# Patient Record
Sex: Male | Born: 1950 | Race: Black or African American | Hispanic: No | Smoking: Never smoker
Health system: Southern US, Community
[De-identification: ages and names within clinical notes are randomized; demographics above are authoritative.]

## PROBLEM LIST (undated history)

## (undated) DIAGNOSIS — A630 Anogenital (venereal) warts: Principal | ICD-10-CM

## (undated) DIAGNOSIS — Z21 Asymptomatic human immunodeficiency virus [HIV] infection status: Secondary | ICD-10-CM

## (undated) DIAGNOSIS — B20 Human immunodeficiency virus [HIV] disease: Secondary | ICD-10-CM

## (undated) DIAGNOSIS — F32A Depression, unspecified: Secondary | ICD-10-CM

## (undated) DIAGNOSIS — F329 Major depressive disorder, single episode, unspecified: Secondary | ICD-10-CM

## (undated) HISTORY — DX: Anogenital (venereal) warts: A63.0

## (undated) HISTORY — DX: Human immunodeficiency virus (HIV) disease: B20

## (undated) HISTORY — DX: Asymptomatic human immunodeficiency virus (hiv) infection status: Z21

## (undated) HISTORY — PX: KNEE SURGERY: SHX244

---

## 2000-03-07 ENCOUNTER — Inpatient Hospital Stay (HOSPITAL_COMMUNITY): Admission: EM | Admit: 2000-03-07 | Discharge: 2000-03-08 | Payer: Self-pay | Admitting: Emergency Medicine

## 2000-03-07 ENCOUNTER — Encounter: Payer: Self-pay | Admitting: Emergency Medicine

## 2000-03-08 ENCOUNTER — Encounter: Payer: Self-pay | Admitting: Internal Medicine

## 2001-11-29 ENCOUNTER — Encounter: Payer: Self-pay | Admitting: Emergency Medicine

## 2001-11-29 ENCOUNTER — Inpatient Hospital Stay (HOSPITAL_COMMUNITY): Admission: EM | Admit: 2001-11-29 | Discharge: 2001-12-03 | Payer: Self-pay | Admitting: Emergency Medicine

## 2002-01-05 ENCOUNTER — Inpatient Hospital Stay (HOSPITAL_COMMUNITY): Admission: EM | Admit: 2002-01-05 | Discharge: 2002-01-06 | Payer: Self-pay | Admitting: *Deleted

## 2010-02-13 ENCOUNTER — Ambulatory Visit: Payer: Self-pay | Admitting: Psychiatry

## 2010-02-13 ENCOUNTER — Encounter: Payer: Self-pay | Admitting: Emergency Medicine

## 2010-03-04 ENCOUNTER — Emergency Department (HOSPITAL_COMMUNITY): Admission: EM | Admit: 2010-03-04 | Discharge: 2010-03-04 | Payer: Self-pay | Admitting: Emergency Medicine

## 2010-03-11 ENCOUNTER — Emergency Department (HOSPITAL_COMMUNITY): Admission: EM | Admit: 2010-03-11 | Discharge: 2010-03-11 | Payer: Self-pay | Admitting: Emergency Medicine

## 2010-04-25 ENCOUNTER — Emergency Department (HOSPITAL_COMMUNITY): Admission: EM | Admit: 2010-04-25 | Discharge: 2010-04-25 | Payer: Self-pay | Admitting: Emergency Medicine

## 2010-05-01 ENCOUNTER — Emergency Department (HOSPITAL_COMMUNITY): Admission: EM | Admit: 2010-05-01 | Discharge: 2010-05-01 | Payer: Self-pay | Admitting: Emergency Medicine

## 2010-05-19 ENCOUNTER — Emergency Department (HOSPITAL_COMMUNITY): Admission: EM | Admit: 2010-05-19 | Discharge: 2010-05-19 | Payer: Self-pay | Admitting: Emergency Medicine

## 2010-06-07 ENCOUNTER — Encounter
Admission: RE | Admit: 2010-06-07 | Discharge: 2010-06-26 | Payer: Self-pay | Source: Home / Self Care | Attending: *Deleted | Admitting: *Deleted

## 2010-06-29 ENCOUNTER — Emergency Department (HOSPITAL_COMMUNITY): Admission: EM | Admit: 2010-06-29 | Discharge: 2010-06-29 | Payer: Self-pay | Admitting: Emergency Medicine

## 2010-07-20 ENCOUNTER — Inpatient Hospital Stay (HOSPITAL_COMMUNITY): Admission: AD | Admit: 2010-07-20 | Discharge: 2010-02-15 | Payer: Self-pay | Admitting: Psychiatry

## 2010-08-09 ENCOUNTER — Emergency Department (HOSPITAL_COMMUNITY)
Admission: EM | Admit: 2010-08-09 | Discharge: 2010-08-09 | Payer: Self-pay | Source: Home / Self Care | Admitting: Emergency Medicine

## 2010-08-17 ENCOUNTER — Encounter: Payer: Self-pay | Admitting: Adult Health

## 2010-08-17 DIAGNOSIS — B20 Human immunodeficiency virus [HIV] disease: Secondary | ICD-10-CM | POA: Insufficient documentation

## 2010-08-28 ENCOUNTER — Encounter
Admission: RE | Admit: 2010-08-28 | Discharge: 2010-09-12 | Payer: Self-pay | Source: Home / Self Care | Attending: Orthopedic Surgery | Admitting: Orthopedic Surgery

## 2010-08-29 ENCOUNTER — Ambulatory Visit
Admission: RE | Admit: 2010-08-29 | Discharge: 2010-08-29 | Payer: Self-pay | Source: Home / Self Care | Attending: Adult Health | Admitting: Adult Health

## 2010-08-29 ENCOUNTER — Encounter: Payer: Self-pay | Admitting: Adult Health

## 2010-08-29 LAB — CONVERTED CEMR LAB
ALT: 25 units/L (ref 0–53)
AST: 29 units/L (ref 0–37)
Albumin: 4.5 g/dL (ref 3.5–5.2)
Bacteria, UA: NONE SEEN
Basophils Absolute: 0 10*3/uL (ref 0.0–0.1)
Bilirubin Urine: NEGATIVE
Calcium: 9.4 mg/dL (ref 8.4–10.5)
Eosinophils Absolute: 0.1 10*3/uL (ref 0.0–0.7)
GC Probe Amp, Urine: NEGATIVE
HDL: 44 mg/dL (ref >39)
HIV-1 RNA Quant, Log: 4.73 — ABNORMAL HIGH (ref <1.30)
Hemoglobin: 15 g/dL (ref 13.0–17.0)
Hep B Core Total Ab: POSITIVE — AB
Hepatitis B Surface Ag: NEGATIVE
Lymphocytes Relative: 19 % (ref 12–46)
Lymphs Abs: 0.7 10*3/uL (ref 0.7–4.0)
MCHC: 33.6 g/dL (ref 30.0–36.0)
MCV: 92.9 fL (ref 78.0–100.0)
Monocytes Relative: 11 % (ref 3–12)
Neutrophils Relative %: 68 % (ref 43–77)
Platelets: 133 10*3/uL — ABNORMAL LOW (ref 150–400)
RDW: 13.3 % (ref 11.5–15.5)
Sodium: 143 meq/L (ref 135–145)
Specific Gravity, Urine: 1.024 (ref 1.005–1.030)
Squamous Epithelial / LPF: NONE SEEN /lpf
Total Bilirubin: 0.3 mg/dL (ref 0.3–1.2)
Triglycerides: 89 mg/dL (ref <150)
Urine Glucose: NEGATIVE mg/dL
VLDL: 18 mg/dL (ref 0–40)
pH: 6.5 (ref 5.0–8.0)

## 2010-09-04 LAB — T-HELPER CELL (CD4) - (RCID CLINIC ONLY): CD4 % Helper T Cell: 6 % — ABNORMAL LOW (ref 33–55)

## 2010-09-12 ENCOUNTER — Ambulatory Visit: Admit: 2010-09-12 | Payer: Self-pay | Admitting: Adult Health

## 2010-09-14 NOTE — Miscellaneous (Signed)
Summary: Orders Update  Clinical Lists Changes  Problems: Added new problem of HIV INFECTION (ICD-042) Orders: Added new Test order of T-Chlamydia  Probe, urine [phone]) - Signed Added new Test order of T-CBC w/Diff [phone]) - Signed Added new Test order of T-CD4SP [facility] [place]) (CD4SP) - Signed Added new Test order of T-GC Probe, urine [phone]) - Signed Added new Test order of T-Comprehensive Metabolic Panel [phone]) - Signed Added new Test order of T-Hepatitis B Surface Antigen [phone]) - Signed Added new Test order of T-Hepatitis B Surface Antibody [phone]) - Signed Added new Test order of T-Hepatitis B Core Antibody ([id number]) - Signed Added new Test order of T-Hepatitis C Antibody ([id number]) - Signed Added new Test order of T-Hepatitis A Antibody ([id number]) - Signed Added new Test order of T-HIV Ab Confirmatory Test/Western Blot ([id number]) - Signed Added new Test order of T-Lipid Profile ([id number]) - Signed Added new Test order of T-RPR (Syphilis) ([id number]) - Signed Added new Test order of T-Urinalysis ([id number]) - Signed Added new Test order of T-HIV Viral Load ([id number]) - Signed

## 2010-09-14 NOTE — Miscellaneous (Signed)
Summary: HIPAA Restrictions  HIPAA Restrictions   Imported By: Florinda Marker 08/29/2010 09:59:07  _____________________________________________________________________  External Attachment:    Type:   Image     Comment:   External Document

## 2010-09-15 ENCOUNTER — Ambulatory Visit: Payer: Self-pay | Admitting: Adult Health

## 2010-09-22 ENCOUNTER — Encounter: Payer: Self-pay | Admitting: Adult Health

## 2010-09-22 ENCOUNTER — Ambulatory Visit (INDEPENDENT_AMBULATORY_CARE_PROVIDER_SITE_OTHER): Payer: Medicaid Other | Admitting: Adult Health

## 2010-09-22 DIAGNOSIS — B1710 Acute hepatitis C without hepatic coma: Secondary | ICD-10-CM | POA: Insufficient documentation

## 2010-09-22 DIAGNOSIS — B20 Human immunodeficiency virus [HIV] disease: Secondary | ICD-10-CM

## 2010-09-22 LAB — CONVERTED CEMR LAB
HCV Quantitative: 20300 intl units/mL — ABNORMAL HIGH (ref <43)
HIV 1 RNA Quant: 59400 copies/mL — ABNORMAL HIGH (ref <20)
HIV-1 RNA Quant, Log: 4.77 — ABNORMAL HIGH (ref <1.30)

## 2010-10-06 ENCOUNTER — Ambulatory Visit (INDEPENDENT_AMBULATORY_CARE_PROVIDER_SITE_OTHER): Payer: Medicaid Other | Admitting: Adult Health

## 2010-10-06 ENCOUNTER — Encounter (INDEPENDENT_AMBULATORY_CARE_PROVIDER_SITE_OTHER): Payer: Self-pay | Admitting: *Deleted

## 2010-10-06 DIAGNOSIS — B20 Human immunodeficiency virus [HIV] disease: Secondary | ICD-10-CM

## 2010-10-10 NOTE — Assessment & Plan Note (Signed)
Summary: f/u   Vital Signs:  Patient profile:   60 year old male Height:      70 inches Weight:      193 pounds BMI:     27.79 Temp:     98.2 degrees F oral Pulse rate:   67 / minute BP sitting:   154 / 92  (left arm)  Vitals Entered By: Alesia Morin CMA (September 22, 2010 11:08 AM) CC: follow-up visit for labs, and to discuss medication Is Patient Diabetic? No Pain Assessment Patient in pain? no      Nutritional Status BMI of 25 - 29 = overweight Nutritional Status Detail appetite "good"  Have you ever been in a relationship where you felt threatened, hurt or afraid?No   Does patient need assistance? Functional Status Self care Ambulation Normal Comments out for about 3 months   CC:  follow-up visit for labs and and to discuss medication.  Preventive Screening-Counseling & Management  Alcohol-Tobacco     Alcohol drinks/day: 0     Smoking Status: never  Caffeine-Diet-Exercise     Caffeine use/day: 0  Safety-Violence-Falls     Seat Belt Use: yes      Sexual History:  HIV + partner.        Drug Use:  former and marijuana.        Blood Transfusions:  no.        Travel History:  no.    Allergies (verified): 1)  ! Bactrim (Sulfamethoxazole-Trimethoprim) 2)  ! Seroquel (Quetiapine Fumarate)  Past History:  Past Surgical History: Left knee ligament repair 2008  Social History: Drug Use:  former, marijuana Sexual History:  HIV + partner Blood Transfusions:  no Travel History:  no  Review of Systems General:  Denies chills, fatigue, fever, loss of appetite, malaise, sleep disorder, sweats, weakness, and weight loss. Eyes:  Denies blurring, discharge, double vision, eye irritation, eye pain, halos, itching, light sensitivity, red eye, vision loss-1 eye, and vision loss-both eyes. ENT:  Denies decreased hearing, difficulty swallowing, ear discharge, earache, hoarseness, nasal congestion, nosebleeds, postnasal drainage, ringing in ears, sinus pressure, and  sore throat. CV:  Denies bluish discoloration of lips or nails, chest pain or discomfort, difficulty breathing at night, difficulty breathing while lying down, fainting, fatigue, leg cramps with exertion, lightheadness, near fainting, palpitations, shortness of breath with exertion, swelling of feet, swelling of hands, and weight gain. Resp:  Denies chest discomfort, chest pain with inspiration, cough, coughing up blood, excessive snoring, hypersomnolence, morning headaches, pleuritic, shortness of breath, sputum productive, and wheezing. GI:  Denies abdominal pain, bloody stools, change in bowel habits, constipation, dark tarry stools, diarrhea, excessive appetite, gas, hemorrhoids, indigestion, loss of appetite, nausea, vomiting, vomiting blood, and yellowish skin color. GU:  Denies decreased libido, discharge, dysuria, erectile dysfunction, genital sores, hematuria, incontinence, nocturia, urinary frequency, and urinary hesitancy. MS:  Complains of joint pain. Derm:  Denies changes in color of skin, changes in nail beds, dryness, excessive perspiration, flushing, hair loss, insect bite(s), itching, lesion(s), poor wound healing, and rash. Neuro:  Denies brief paralysis, difficulty with concentration, disturbances in coordination, falling down, headaches, inability to speak, memory loss, numbness, poor balance, seizures, sensation of room spinning, tingling, tremors, visual disturbances, and weakness. Psych:  Complains of depression and mental problems; denies alternate hallucination ( auditory/visual), anxiety, easily angered, easily tearful, irritability, panic attacks, sense of great danger, suicidal thoughts/plans, thoughts of violence, unusual visions or sounds, and thoughts /plans of harming others. Endo:  Denies cold intolerance, excessive  hunger, excessive thirst, excessive urination, heat intolerance, polyuria, and weight change. Heme:  Denies abnormal bruising, bleeding, enlarge lymph nodes,  fevers, pallor, and skin discoloration.   Medications Added to Medication List This Visit: 1)  Dapsone 100 Mg Tabs (Dapsone) .... Take one (1) tablet by mouth once daily  Other Orders: T-Hepatitis C Viral Load ([id number]) T-HIV1 Quant rflx Ultra or Genotype ([id number]) New Patient Level IV (29562) Prescriptions: DAPSONE 100 MG TABS (DAPSONE) Take one (1) tablet by mouth once daily  #30 x 5   Entered and Authorized by:   Talmadge Chad NP   Signed by:   Talmadge Chad NP on 09/22/2010   Method used:   Electronically to        [facility]  [address] [phone]* (retail)       [address]       [place], [place]  [address]       Ph: [phone] or [phone]       Fax: [phone]   RxID:   [id number]    Orders Added: 1)  T-Hepatitis C Viral Load [phone] 2)  T-HIV1 Quant rflx Ultra or Genotype [phone] 3)  New Patient Level IV [35573]

## 2010-10-10 NOTE — Miscellaneous (Addendum)
Summary: Bridge Counselor  Clinical Lists Changes BC transported pt to [facility] today for his scheduled appointent with Traci Sermon. Pt was put on Truvada and Isentress today. BC discussed with pt each of medications and when and how to take them. Pt expressed understanding. BC will follow pt until he is on his medications without problem and then refer him to [facility] for ongoing case management.

## 2010-10-28 LAB — DIFFERENTIAL
Basophils Absolute: 0 10*3/uL (ref 0.0–0.1)
Basophils Absolute: 0 10*3/uL (ref 0.0–0.1)
Basophils Relative: 0 % (ref 0–1)
Eosinophils Relative: 3 % (ref 0–5)
Lymphocytes Relative: 20 % (ref 12–46)
Lymphocytes Relative: 21 % (ref 12–46)
Lymphs Abs: 1.2 10*3/uL (ref 0.7–4.0)
Monocytes Relative: 11 % (ref 3–12)
Monocytes Relative: 13 % — ABNORMAL HIGH (ref 3–12)
Neutro Abs: 3.9 10*3/uL (ref 1.7–7.7)
Neutrophils Relative %: 63 % (ref 43–77)

## 2010-10-28 LAB — POCT I-STAT, CHEM 8
BUN: 17 mg/dL (ref 6–23)
Calcium, Ion: 1.06 mmol/L — ABNORMAL LOW (ref 1.12–1.32)
Calcium, Ion: 1.12 mmol/L (ref 1.12–1.32)
Creatinine, Ser: 1.1 mg/dL (ref 0.4–1.5)
Creatinine, Ser: 1.2 mg/dL (ref 0.4–1.5)
Glucose, Bld: 113 mg/dL — ABNORMAL HIGH (ref 70–99)
HCT: 44 % (ref 39.0–52.0)
HCT: 44 % (ref 39.0–52.0)
Hemoglobin: 15 g/dL (ref 13.0–17.0)
Hemoglobin: 15 g/dL (ref 13.0–17.0)
Sodium: 142 mEq/L (ref 135–145)
Sodium: 142 mEq/L (ref 135–145)
TCO2: 27 mmol/L (ref 0–100)

## 2010-10-28 LAB — RAPID URINE DRUG SCREEN, HOSP PERFORMED
Amphetamines: NOT DETECTED
Barbiturates: NOT DETECTED
Benzodiazepines: NOT DETECTED
Cocaine: NOT DETECTED
Tetrahydrocannabinol: NOT DETECTED

## 2010-10-28 LAB — CBC
HCT: 40.3 % (ref 39.0–52.0)
Hemoglobin: 13.4 g/dL (ref 13.0–17.0)
Hemoglobin: 14.1 g/dL (ref 13.0–17.0)
MCH: 31.9 pg (ref 26.0–34.0)
MCH: 32.2 pg (ref 26.0–34.0)
MCHC: 33.3 g/dL (ref 30.0–36.0)
MCHC: 33.5 g/dL (ref 30.0–36.0)
MCV: 95.8 fL (ref 78.0–100.0)
Platelets: 116 10*3/uL — ABNORMAL LOW (ref 150–400)
RDW: 16 % — ABNORMAL HIGH (ref 11.5–15.5)
RDW: 16.2 % — ABNORMAL HIGH (ref 11.5–15.5)

## 2010-10-28 LAB — SEDIMENTATION RATE: Sed Rate: 9 mm/hr (ref 0–16)

## 2010-10-29 LAB — BASIC METABOLIC PANEL WITH GFR
BUN: 9 mg/dL (ref 6–23)
CO2: 28 meq/L (ref 19–32)
Calcium: 9.1 mg/dL (ref 8.4–10.5)
GFR calc Af Amer: >60 mL/min (ref >60)
GFR calc non Af Amer: >60 mL/min (ref >60)
Glucose, Bld: 96 mg/dL (ref 70–99)
Sodium: 143 meq/L (ref 135–145)

## 2010-10-29 LAB — DIFFERENTIAL
Basophils Absolute: 0 K/uL (ref 0.0–0.1)
Basophils Relative: 0 % (ref 0–1)
Eosinophils Absolute: 0.1 K/uL (ref 0.0–0.7)
Eosinophils Relative: 1 % (ref 0–5)
Lymphocytes Relative: 20 % (ref 12–46)
Lymphs Abs: 1.1 K/uL (ref 0.7–4.0)
Monocytes Absolute: 0.5 10*3/uL (ref 0.1–1.0)
Monocytes Relative: 9 % (ref 3–12)
Neutro Abs: 3.9 K/uL (ref 1.7–7.7)
Neutrophils Relative %: 70 % (ref 43–77)

## 2010-10-29 LAB — URINALYSIS, ROUTINE W REFLEX MICROSCOPIC
Glucose, UA: NEGATIVE mg/dL
Hgb urine dipstick: NEGATIVE
Ketones, ur: NEGATIVE mg/dL
Nitrite: NEGATIVE
Protein, ur: NEGATIVE mg/dL
Specific Gravity, Urine: 1.028 (ref 1.005–1.030)
Urobilinogen, UA: 1 mg/dL (ref 0.0–1.0)
pH: 6 (ref 5.0–8.0)

## 2010-10-29 LAB — RAPID URINE DRUG SCREEN, HOSP PERFORMED
Amphetamines: NOT DETECTED
Barbiturates: NOT DETECTED
Benzodiazepines: NOT DETECTED
Cocaine: POSITIVE — AB
Opiates: NOT DETECTED
Tetrahydrocannabinol: NOT DETECTED

## 2010-10-29 LAB — CBC
HCT: 43.6 % (ref 39.0–52.0)
Hemoglobin: 14.4 g/dL (ref 13.0–17.0)
MCH: 31.4 pg (ref 26.0–34.0)
MCHC: 33 g/dL (ref 30.0–36.0)
MCV: 95.3 fL (ref 78.0–100.0)
Platelets: 142 10*3/uL — ABNORMAL LOW (ref 150–400)
RBC: 4.57 MIL/uL (ref 4.22–5.81)
RDW: 15.1 % (ref 11.5–15.5)
WBC: 5.5 K/uL (ref 4.0–10.5)

## 2010-10-29 LAB — ETHANOL: Alcohol, Ethyl (B): <5 mg/dL (ref 0–10)

## 2010-10-29 LAB — BASIC METABOLIC PANEL
Chloride: 109 mEq/L (ref 96–112)
Creatinine, Ser: 1.22 mg/dL (ref 0.4–1.5)
Potassium: 3.3 mEq/L — ABNORMAL LOW (ref 3.5–5.1)

## 2010-10-29 LAB — POCT CARDIAC MARKERS
CKMB, poc: <1 ng/mL — ABNORMAL LOW (ref 1.0–8.0)
Myoglobin, poc: 105 ng/mL (ref 12–200)
Troponin i, poc: <0.05 ng/mL (ref 0.00–0.09)
Troponin i, poc: <0.05 ng/mL (ref 0.00–0.09)

## 2010-11-03 ENCOUNTER — Other Ambulatory Visit: Payer: Medicaid Other

## 2010-11-16 ENCOUNTER — Ambulatory Visit (INDEPENDENT_AMBULATORY_CARE_PROVIDER_SITE_OTHER): Payer: Medicaid Other | Admitting: Adult Health

## 2010-11-16 ENCOUNTER — Encounter: Payer: Self-pay | Admitting: Adult Health

## 2010-11-16 VITALS — BP 126/83 | HR 83 | Temp 98.2°F | Ht 70.0 in | Wt 195.0 lb

## 2010-11-16 DIAGNOSIS — B20 Human immunodeficiency virus [HIV] disease: Secondary | ICD-10-CM

## 2010-11-16 LAB — DIFFERENTIAL
Basophils Relative: 0 % (ref 0–1)
Lymphocytes Relative: 24 % (ref 12–46)
Lymphs Abs: 1.7 10*3/uL (ref 0.7–4.0)

## 2010-11-16 NOTE — Progress Notes (Signed)
  Subjective:    Patient ID: Nathan Ewing, male    DOB: 09/17/1950, 60 y.o.   MRN: [id number]  HPI No labs drawn for evaluation.  Claims transportation issues.  States had to walk here from home taking 1.5 hours to get here.  Missed f/u appointment with ortho due to transportation as well.  Claims adherent to HIV meds with good tolerance.    Review of Systems  Constitutional: Negative.   HENT: Negative.   Eyes: Negative.   Respiratory: Negative.   Cardiovascular: Negative.   Gastrointestinal: Negative.   Genitourinary: Negative.   Musculoskeletal: Positive for myalgias, joint swelling, arthralgias and gait problem. Negative for back pain.  Skin: Negative.   Neurological: Negative.   Hematological: Negative.   Psychiatric/Behavioral: Negative.        Objective:   Physical Exam  Constitutional: He is oriented to person, place, and time. He appears well-developed and well-nourished.  HENT:  Head: Normocephalic and atraumatic.  Right Ear: External ear normal.  Left Ear: External ear normal.  Nose: Nose normal.  Mouth/Throat: Oropharynx is clear and moist.  Eyes: Conjunctivae are normal. Pupils are equal, round, and reactive to light.  Neck: Normal range of motion. Neck supple.  Cardiovascular: Normal rate, regular rhythm, normal heart sounds and intact distal pulses.   Pulmonary/Chest: Effort normal and breath sounds normal.  Abdominal: Soft. Bowel sounds are normal.  Musculoskeletal:       Left knee: He exhibits decreased range of motion, swelling and bony tenderness. He exhibits no effusion, no ecchymosis, no deformity, no laceration, no erythema, normal alignment, no LCL laxity and normal patellar mobility. tenderness found.  Neurological: He is alert and oriented to person, place, and time. Coordination normal.  Skin: Skin is warm and dry.  Psychiatric: He has a normal mood and affect. His behavior is normal. Judgment and thought content normal.          Assessment &  Plan:  HIV:  No current labs to evaluate.  Obtain CBC, CMP, CD4, VL today, have him RTC 2 weeks for f/u. Left knee complications:  Needs to re-schedule f/u with ortho. Social/transportation issues:  In detailed discussion with his [facility], we learned he was dschg'd from services last week due to no contact and lack of f/u with him.  He has Medicaid, can arrange for transportation, and he only lives no more than 1 mile from clinic.   Questions raised about Ortho eval.  He apparently owes 3 separate ortho practices money for co-pays for which [facility] provided funds to pay, but he allegedly has not made any payments.  BC is under impression there may be illicit drug use involved.  He endorsed to her he has had intake with [facility].  We will recommend he f/u with [facility] and contact Medicaid for transportation arrangements for future visits.  We will discuss with him on RTC issues related to his ortho f/u.  Will obtain random urine drug screen on f/u as well.

## 2010-11-17 LAB — CBC
MCHC: 32.4 g/dL (ref 30.0–36.0)
Platelets: 160 10*3/uL (ref 150–400)
RBC: 4 MIL/uL — ABNORMAL LOW (ref 4.22–5.81)
WBC: 6.9 10*3/uL (ref 4.0–10.5)

## 2010-11-17 LAB — COMPLETE METABOLIC PANEL WITH GFR
ALT: 37 U/L (ref 0–53)
Albumin: 4.2 g/dL (ref 3.5–5.2)
Alkaline Phosphatase: 67 U/L (ref 39–117)
Calcium: 9.8 mg/dL (ref 8.4–10.5)
Chloride: 103 mEq/L (ref 96–112)
Creat: 1.22 mg/dL (ref 0.40–1.50)
GFR, Est African American: >60 mL/min (ref >60)
GFR, Est Non African American: >60 mL/min (ref >60)
Glucose, Bld: 122 mg/dL — ABNORMAL HIGH (ref 70–99)

## 2010-11-17 LAB — T-HELPER CELL (CD4) - (RCID CLINIC ONLY): CD4 % Helper T Cell: 8 % — ABNORMAL LOW (ref 33–55)

## 2010-11-18 LAB — HIV-1 RNA QUANT-NO REFLEX-BLD
HIV 1 RNA Quant: 23 copies/mL — ABNORMAL HIGH (ref <20)
HIV-1 RNA Quant, Log: 1.36 {Log} — ABNORMAL HIGH (ref <1.30)

## 2010-12-01 ENCOUNTER — Ambulatory Visit (INDEPENDENT_AMBULATORY_CARE_PROVIDER_SITE_OTHER): Payer: Medicaid Other | Admitting: Adult Health

## 2010-12-01 VITALS — BP 144/77 | HR 60 | Temp 98.1°F | Ht 70.0 in | Wt 197.0 lb

## 2010-12-01 DIAGNOSIS — Z23 Encounter for immunization: Secondary | ICD-10-CM

## 2010-12-01 DIAGNOSIS — Z0000 Encounter for general adult medical examination without abnormal findings: Secondary | ICD-10-CM

## 2010-12-01 DIAGNOSIS — Z79899 Other long term (current) drug therapy: Secondary | ICD-10-CM

## 2010-12-01 DIAGNOSIS — B20 Human immunodeficiency virus [HIV] disease: Secondary | ICD-10-CM

## 2010-12-01 MED ORDER — EMTRICITABINE-TENOFOVIR DF 200-300 MG PO TABS: 1.0000 | ORAL_TABLET | Freq: Every day | ORAL | Status: DC

## 2010-12-01 MED ORDER — RALTEGRAVIR POTASSIUM 400 MG PO TABS: 400.0000 mg | ORAL_TABLET | Freq: Two times a day (BID) | ORAL | Status: DC

## 2010-12-01 MED ORDER — DAPSONE 100 MG PO TABS: 100.0000 mg | ORAL_TABLET | Freq: Every day | ORAL | Status: DC

## 2010-12-01 NOTE — Progress Notes (Signed)
  Subjective:    Patient ID: Nathan Ewing, male    DOB: [birth year], 60 y.o.   MRN: [id number]  HPI presents today for lab followup on blood obtained 2 weeks ago. He voices no complaints at present, and its adherence to his medications with good tolerance.    Review of Systems  Constitutional: Negative.   HENT: Negative.   Eyes: Negative.   Respiratory: Negative.   Cardiovascular: Negative.   Gastrointestinal: Negative.   Genitourinary: Negative.   Musculoskeletal: Negative.   Skin: Negative.   Neurological: Negative.   Hematological: Negative.   Psychiatric/Behavioral: Negative.        Objective:   Physical Exam  Constitutional: He is oriented to person, place, and time. He appears well-developed and well-nourished.  HENT:  Head: Normocephalic and atraumatic.  Right Ear: External ear normal.  Left Ear: External ear normal.  Nose: Nose normal.  Mouth/Throat: Oropharynx is clear and moist.  Eyes: Conjunctivae and EOM are normal. Pupils are equal, round, and reactive to light.  Neck: Normal range of motion. Neck supple.  Cardiovascular: Normal rate, regular rhythm, normal heart sounds and intact distal pulses.   Pulmonary/Chest: Effort normal and breath sounds normal.  Abdominal: Soft. Bowel sounds are normal.  Musculoskeletal: Normal range of motion.  Neurological: He is alert and oriented to person, place, and time. No cranial nerve deficit. He exhibits normal muscle tone. Coordination normal.  Skin: Skin is warm and dry.  Psychiatric: He has a normal mood and affect. His behavior is normal. Judgment and thought content normal.          Assessment & Plan:  1. HIV. CD4 on April 5 was 120 at 8% with an HIV viral load of 23 copies per mL. This is demonstrating good neurologic response, but a slowed immunologic response. We will continue present management and ask that he have fasting labs drawn again in 6 weeks with a followup in 2 months. He verbally acknowledged this  information and agreed with plan.

## 2010-12-29 NOTE — Discharge Summary (Signed)
. [facility]  Patient:    Nathan Ewing, Nathan Ewing Visit Number: [id number] MRN: [id number]          Service Type: MED Location: [phone] Attending Physician:  Alfonso Ramus Dictated by:   Hillery Aldo, M.D. Admit Date:  11/29/2001 Discharge Date: 12/03/2001   CC:         Outpatient Clinic   Discharge Summary  DISCHARGE DIAGNOSES: 1. Cocaine-induced cardiac ischemia and chest pain. 2. Cocaine abuse. 3. Elevated creatinine kinase. 4. Depression.  DISCHARGE MEDICATIONS: 1. Aspirin 325 mg p.o. q.d. 2. Flexeril 10 mg p.o. q8 hours p.r.n. 3. Tylenol 1000 mg p.o. q6 hours p.r.n.  PROCEDURES AND DIAGNOSTIC STUDIES: 1. Chest x-ray November 29, 2001:  Normal chest. 2. Cardiolite December 02, 2001:  Normal stress myocardial SPECT.  Normal left    ventricular wall motion study, with calculated ejection fraction of 61%. 3. 2-D echocardiogram December 01, 2001:  Overall left ventricular systolic    function was normal.  Left ventricular ejection fraction estimated to be    55-65%.  There were no left ventricular regional wall motion abnormalities.  CONSULTATIONS:  [facility].  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  The patient previously had reported central intermittent non-radiating chest pain that lasts approximately 24 hours and goes away with "lying still".  Chest pain is accompanied by light headedness, diaphoresis and shortness of breath.  Patient admits to having used approximately 4 gm of cocaine on November 27, 2001 and reported that the chest pain returned and was longer lasting than it had been in the past after he used this cocaine.  Patient used an additional 4 gm on November 28, 2001 and again the morning of his admission.   Pain is currently central and rated 7-8:10.  Patient denies any sensation to being light-headed at the present time.  He is not short of breath.  Prior to this recent cocaine binge, the patient states that he was "clean" x 1 1/2 years.  The patient denies any seizures.  Patient reports that his pain has an anginal quality in that it comes on with activity and subsides at rest.  The patient also reports some dyspnea on exertion as well as paroxysmal nocturnal dyspnea that awakens him at night.  Patient states that he cannot sleep flat on his back secondary to a strangling sensation for the past five to six years.  The patient denies any lower extremity swelling or nocturia.  PHYSICAL EXAMINATION:  Temperature 97.3, blood pressure 146/81, pulse 88, respirations 20.  GENERAL:  Well developed, well nourished black male who is tearful and anxious but otherwise in no apparent distress.  HEENT:  Normocephalic, atraumatic.  PERRL.  EOMI.  Oropharynx is clear.  Neck is supple without thyromegaly, lymphadenopathy or bruits.  CHEST:  Lungs clear to auscultation bilaterally with good air movement.  HEART:  Bradycardic but regular.  ABDOMEN:  Abdomen is soft, nontender, nondistended with bowel sounds present x four.  RECTAL:  Good tone.  Hemoccult negative stool.  EXTREMITIES:  No clubbing, edema or cyanosis.  Admission EKG revealed sinus bradycardia at 50 beats per minute with T-wave inversions and lead 2, 3, AVF and V1.  LABORATORY DATA:  ABG showed pH 7.413, PCO2 41.2, bicarbonates 26, hemoglobin 15.5, hematocrit 45.5, WBC 7.6 and  platelets 149.  There were 87% neutrophils, 10% lymphocytes and 3% monocytes on differential.  PT 14.4, INR 1.2, PTT 29. Sodium 140, potassium 3.5, chloride 106, bicarbonates 27, glucose 81, BUN 8, creatinine 1.1, calcium 9.1, total protein 7.8, albumin 0.9, AST 31, ALT 24, alkaline phosphatase 73 and total bilirubin 0.8.  Initial CKs were 280, 176 and 160.  CK-MB 2.8, 1.3 and 1.3.  Troponin I of 0.01, 0.02 and less than 0.01. Lipid profile showed cholesterol 120, triglycerides 51, HDL 40 and LDL 70. TSH was 1.415.  Urine drug screen positive for cocaine.  Urinalysis negative for protein, glucose and nitrites.  There were 15 mg/dL of ketones.  Urine culture was negative.  HOSPITAL COURSE: #1 - CHEST PAIN:  Patients chest pain was felt likely to be secondary to vasospasm given his history of cocaine use but, due to his significant risk factors and the anginal quality of his chest pain complaints, he was scheduled for a cardiac work-up.  Cardiac work-up was subsequently found to be unrevealing.  He was initially started on heparin therapy and nitroglycerin which was rapidly titrated off.  Myocardial infarction was ruled out with three sets of negative enzymes.  His chest pain gradually improved over the course of his hospitalization and he is chest-pain free at the time of his discharge.  There is no evidence of any inducible ischemia on the Cardiolite. He was instructed to take an aspirin a day.  #2 - COCAINE ABUSE:  The patient had a binge-type pattern with long interval of absence in between.  Patient was evaluated by [facility] and offered substance abuse counseling.  Patient was given the telephone number to the Mental Health Department and the [facility].  #3 - SOCIAL:  The patient was homeless and social work was involved in obtaining a disposition for the patient.  He was allowed to return to the [facility] and transportation was arranged for him to get there.  DISCHARGE INSTRUCTIONS:  Patient is to resume activity as tolerated.  He is to maintain a low-fat diet.  Patient is to call the M.D. for return of chest pain, chest pain accompanied by nausea or vomiting, sweating or pain that radiates.  Patient was instructed to follow up at the outpatient clinic and  was given the telephone number to secure an appointment at  his convenience.Dictated by:   Hillery Aldo, M.D. Attending Physician:  Alfonso Ramus DD:  12/23/01 TD:  12/25/01 Job: [phone] [id number]

## 2010-12-29 NOTE — Discharge Summary (Signed)
[facility]  Patient:    Nathan Ewing, Nathan Ewing                        MRN: [id number] Adm. Date:  [id number] Disc. Date: [id number] Attending:  Edwyna Perfect Dictator:   Susie Cassette, M.D.                           Discharge Summary  DISCHARGE DIAGNOSIS:  Chest pain and neurological symptoms secondary to vasospasm from Cocaine abuse.  DISCHARGE MEDICATIONS:  None.  FOLLOWUP:  Mr. Matsuo was instructed to call the clinic at [phone] for hospital followup appointment with me, Dr. Wilkie Aye, in the next week.  He is currently staying at [facility] and felt that it would be easier for him to call us rather than [place] to call him.  PROCEDURES:  A CT of the head was done which was normal.  CONSULTATIONS:  None.  HISTORY OF PRESENT ILLNESS:  This is a 60 year old African-American male with a past medical history of cocaine use who presented to the emergency room with blurry vision, left hand and toe numbness, and chest pain.  Mr. Ruhaan reports that, after using 2 g of cocaine the morning of and, he began to experience chest pressure.  He says it felt as if someone were standing on his chest and that there was associated sharp epigastric pain.  He reported shortness of breath and nausea but denied vomiting.  He did receive nitroglycerin in the ER and stated it did not relieve his pain.  In addition, he reported left hand and left toe numbness.  He stated that the left side of his face "doesnt feel right" and was heavier than usual.  He also reports difficulty with balance and stumbling in the a.m.  He believed that his thinking was confused as well. Finally, he stated that he experienced an increase in urinary urgency in the a.m. but was unable to void.  However, he had voided since that time.  REVIEW OF SYSTEMS:  Significant for congestion and cough.  He denies diarrhea, constipation, and dysuria.  Also significant for headache currently.  PAST  MEDICAL HISTORY: 1. Cocaine use for approximately four years. 2. "Spot on liver,"  question cirrhosis, and this was told to him in 1992.  PAST SURGICAL HISTORY:  None.  MEDICATIONS PRIOR TO ADMISSION:  None.  ALLERGIES:  No known drug allergies.  FAMILY MEDICAL HISTORY:  Significant for father who died at age of 39.  He had his first MI at 80 and also had diabetes.  His mother, Mr. Summons mother, died at the age of 64 and had hypertension.  Mr. Leabo also has a sister and brothers with hypertension.  SOCIAL HISTORY:   The patient also had a number of social stressors recently including a recent divorce (six to seven months ago), the death of his mother (three to four months ago), and moving into a shelter [employer]) secondary to divorce.  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 He did live in [place] prior to his divorce and has six children from two marriages. He has one sister here in [place].  He denies tobacco or alcohol use.  He had been clean for six years prior to todays use of cocaine.  He does have a history of cocaine use for approximately four years but, as I mentioned, he had been clean for  six years prior to today.  PHYSICAL EXAMINATION:   Vitals on admission, temperature 98.6, blood pressure 145/68, heart rate 92, respirations 20, saturations 95% on room air.  In general, this is a well-developed, well-nourished, African-American male, somewhat anxious and remorseful.  HEENT: Pupils are equally round and reactive to light.  Extraocular muscles intact.  Oropharynx clear without erythema.  No lymphadenopathy.  Cardiovascular regular rate and rhythm, no murmurs, rubs, or gallops.  Respiratory clear to auscultation bilaterally with bibasilar crackles.  Abdomen: Bowel sounds positive, soft, nondistended, nontender. Extremities warm, dry, 2+ posterior tibial pulses.  No edema bilaterally. Neurological: Cranial nerves II-XII grossly intact.  Sensation  decreased in the left base.  Strength 5/5 in all major muscle groups. Cerebellar symmetrical.  No dysdiadochocinesia.  Reflexes 2+ and symmetrical bilaterally at the brachial radialis, biceps, and quadriceps tendons.  LABORATORY DATA ON ADMISSION:  White count 7.4, hemoglobin 14.5, platelets 146, MCV 90.6.  Sodium 139, potassium 4.0, chloride 103, bicarb 27, BUN 14, creatinine 1.0, glucose 87.  CK 974, MB 3.1, troponin less than 0.03.  CK second set 723, MB 2.5, troponin less than 0.03.  PT 14.2, INR 1.2, PTT 27. Urine drug screen positive for cocaine and opiates.  Urinalysis: Specific gravity 1.037, small bilirubin, 15 ketones, small blood, rare epithelial cells, 11 to 20 red blood cells, few bacteria.  EKG normal sinus rhythm with no acute ischemic changes.  Chest x-ray: Bibasilar atelectasis, no acute disease.  HOSPITAL COURSE:  #1 - CHEST PAIN:  Mr. Pitcock was initially admitted to rule out a myocardial infarction.  Serial enzymes were checked as well as EKGs.  His CKs were found to be somewhat elevated with 974 being his initial value and 573 his value prior to discharge.  However, his CK-MB and troponin were all within normal limits; therefore, he was felt to have been ruled out for MI.  In addition, a cholesterol panel was checked and was noted to be within normal limits with a cholesterol of 123, triglycerides of 58, HDL 37, and LDL 74.  Mr. Batterman was not noted to have any cardiac risk factors other than cocaine use.  He was not noted to be hypertensive, had a normal lipid profile, denied tobacco use, and did not have any evidence of diabetes nor past history.  #2 - LEFT-SIDED NUMBNESS:  Mr. Eplin did complain of some left-sided neurological symptoms on admission to the ER.  A head CT was done which was revealed to be negative with no evidence of acute process.  #3 - QUESTIONABLE RHABDOMYOLYSIS:  Mr. Lebleu on admission did have elevated CKs to a maximum of approximately  974 on admission.  This was thought to possibly by secondary to a mild rhabdomyolysis and trended downward over the course of his hospital admission with hydration.   #4 - COCAINE USE:  Case management did see Mr. Jimmerson prior to discharge.  He was given information on alcohol and drug services, a drug fact sheet, and also [facility] information if he needed it.  In addition, care management did report that Mr. Rosato said that he would be allowed to go back to [facility].  He was provided with supportive and substance abuse counselling.  DISCHARGE LABORATORY DATA:  White count 4.4, hemoglobin 12.2, platelets 121, MCV 88.5.  Sodium 138, potassium 3.6, chloride 107, bicarb 28, BUN 16, creatinine 1.1, glucose 103.  CONDITION ON DISCHARGE:  Good.  DISPOSITION:  Back to [facility].  PHYSICIANS: Residents: Leory Plowman,  M.D. and Susie Cassette, M.D. Attending: C. Ulyess Mort, M.D. DD:  03/13/00 TD:  03/14/00 Job: [id number]

## 2010-12-29 NOTE — H&P (Signed)
[place]  Patient:    Nathan Ewing, Nathan Ewing Visit Number: [id number] MRN: [id number]          Service Type: MED Location: [id number] Attending Physician:  Corlis Leak. Dictated by:   Barry Dienes. Eloise Harman, M.D. Admit Date:  01/05/2002                           History and Physical  CHIEF COMPLAINT: Chest pain.  HISTORY OF PRESENT ILLNESS: The patient is a 60 year old black male, who presented to the emergency room with substernal chest pain and shortness of breath that started within one hour of taking crack cocaine.  The pain has continued despite nitroglycerin tablet administration.  Currently the pain is 8/10 in intensity and associated with mild shortness of breath.  There is no radiation of the pain and no associated nausea or diaphoresis.  He denies recent fever, chills, or productive cough.  He has a long history of cocaine abuse and was last admission in April 2003.  At that time a Cardiolite exercise test was normal.  PAST MEDICAL HISTORY:  1. Otherwise significant for elevated CPK level.  2. Depression.  MEDICATIONS: None.  ALLERGIES: No known drug allergies.  PAST SURGICAL HISTORY: None.  FAMILY HISTORY: Father died at age 83 of diabetes mellitus.  Mother died at age 8 of congestive heart failure.  He has four brothers, one with diabetes and one with hypertension, and two sisters who are alive and well.  SOCIAL HISTORY: He is separated.  He has seven children.  He denies a history of tobacco abuse or alcohol abuse.  He lives at the [facility] and is unemployed.  REVIEW OF SYSTEMS: He currently has a moderately severe headache without vision change.  He has a tactile temperature and mild shortness of breath.  He continues to have chest pain of 8/10 intensity.  He has no constipation, rectal bleeding, or feelings of depression at this time.  PHYSICAL EXAMINATION:  VITAL SIGNS: Initial vitals included a pulse of 117,  respiratory rate 22, and temperature 97.8 degrees.  Current vitals - blood pressure is 132/79, pulse 77, respiratory rate 20.  Oxygen saturation 97% on room air.  GENERAL: He is a well-nourished, well-developed black male in no apparent distress, alert and oriented x3.  HEENT: Within normal limits.  NECK: Without jugular venous distention or carotid bruit.  CHEST: Clear to auscultation.  HEART: Regular rate and rhythm.  S1 and S2 are present without murmur, gallop, or rub.  ABDOMEN: Normal bowel sounds, with no hepatosplenomegaly or tenderness.  RECTAL: Normal sphincter tone, Hemoccult negative.  The prostate gland was normal.  NEUROLOGIC: Examination nonfocal.  LABORATORY DATA: EKG:  1. Normal sinus rhythm.  2. New T wave inversions in leads V3, V4, and V5 versus an EKG from     April 2003.  Chest x-ray showed no acute cardiopulmonary disease.  WBC 7.4, hemoglobin 15, hematocrit 45, platelets 163,000.  Serum sodium 143, potassium 3.4, chloride 108, CO2 28, BUN 7, creatinine 1.0, glucose 123.  CK 122 with MB 1.0.  Troponin I 0.01.  Current telemetry shows sinus rhythm without ST segment abnormalities.  IMPRESSION/PLAN:  1. Chest pain is likely secondary to cocaine-induced myocardial ischemia     versus a non-Q wave myocardial infarction.  His chest pain is less likely     due to coronary artery disease given that he had a Cardiolite exercise     test  in April 2003 that showed no ischemia.  Noncardiac etiologies of his     chest pain are somewhat less likely.  I plan to start Norvasc, calcium     channel blocker, with nitroglycerin topically as intravenous access has     been difficult.  Beta-blockers will be avoided as this can induced     ischemia.  We will reconsider drug abuse counseling.  Check serial cardiac     isoenzymes to evaluate for possible non-Q wave myocardial infarction.  2. Depression.  This is stable currently off medications. Dictated by:   Barry Dienes.  Eloise Harman, M.D. Attending Physician:  Corlis Leak DD:  01/05/02 TD:  01/05/02 Job: [id number]

## 2010-12-29 NOTE — Discharge Summary (Signed)
[place]  Patient:    Nathan Ewing, Nathan Ewing Visit Number: [id number] MRN: [id number]          Service Type: MED Location: [id number] Attending Physician:  Corlis Leak. Dictated by:   Barry Dienes. Eloise Harman, M.D. Admit Date:  01/05/2002 Discharge Date: 01/06/2002                             Discharge Summary  PERTINENT FINDINGS:  The patient is a 60 year old, black male who presented to the emergency room with substernal chest pain, associated with shortness of breath that started within one hour of taking crack cocaine.  The pain continued despite nitroglycerin tablet administration and was 8/10 intensity at the time of emergency room evaluation associated with mild shortness of breath.  There was no radiation of the pain and no associated nausea or diaphoresis.  He denied recent fever, chills, or productive cough.  He has a long history of cocaine abuse with his last admission being in April 2003.  At that time, a Cardiolite exercise test was normal.  INITIAL PHYSICAL EXAMINATION:  Included pulse of 117, respiratory 22, temperature 97.8, with blood pressure 132/79.  Pulse oxygen saturation levels 97% on room air.  In general, he is a well-nourished, well-developed black male in no apparent distress who was alert and oriented times three.  HEENT exam was within normal limits.  Neck was without jugular venous distension or carotid bruit.  Chest was clear to auscultation.  Heart had a regular rate and rhythm.  S1 and S2 were present without murmur, gallop or rub.  Abdomen had normal bowel sounds with no hepatosplenomegaly or tenderness.  Rectal exam had normal sphincter tone and was hemoccult negative. Prostate gland was normal. Neurological exam was nonfocal.  LABORATORY DATA:  Chest x-ray showed no acute cardiopulmonary disease.  The EKG showed (1) normal sinus rhythm. (2) New T wave inversions in leads V3, V4 and V5 versus comparison EKG from  April 2003.  White blood cell count was 7.4, hemoglobin 15, hematocrit 45, platelet count 163. Serum sodium 143, potassium 3.4, chloride 108, CO2 28, BUN 7, creatinine 1.0, glucose 123.  HOSPITAL COURSE:  The patient was admitted to a telemetry bed and started on Norvasc with a nitroglycerin patch and aspirin.  With these medicines he had mild light headedness and nausea but no vomiting.  His chest pain resolved and he was able to walk in his room without gait instability on the day of discharge.  Serial CPK levels were 122, 88, and 95.  Troponin I levels were 0.01 and 0.01.  PROCEDURES:  None.  CONDITION ON DISCHARGE:  He has very mild light headedness with walking and very mild nausea.  DISCHARGE DIAGNOSES: 1. Chest pain, precordial. 2. Cocaine abuse. 3. Nausea. 4. Depression.  DISCHARGE MEDICATIONS:  Protonix 40 mg p.o. q.d., #30.  SPECIAL INSTRUCTIONS:  The patient was again reminded of the high risk associated with continued cocaine use in terms of elevated risks for heart attack or stroke.  He was seen by a Child psychotherapist who gave him a substance abuse Facilities manager.  He is also to have an upper GI series to evaluate his nausea.  This test was to be scheduled prior to his discharge.  FOLLOW UP PLANS:  He will be seen at the [facility] in approximately one week following discharge and was given instructions on how to arrange that evaluation. Dictated by:  Barry Dienes Eloise Harman, M.D. Attending Physician:  Corlis Leak DD:  01/06/02 TD:  01/07/02 Job: [id number]

## 2011-01-12 ENCOUNTER — Other Ambulatory Visit: Payer: Medicaid Other

## 2011-01-23 ENCOUNTER — Other Ambulatory Visit: Payer: Medicaid Other

## 2011-01-24 ENCOUNTER — Other Ambulatory Visit: Payer: Self-pay | Admitting: Infectious Diseases

## 2011-01-24 ENCOUNTER — Other Ambulatory Visit: Payer: Self-pay | Admitting: *Deleted

## 2011-01-24 ENCOUNTER — Other Ambulatory Visit (INDEPENDENT_AMBULATORY_CARE_PROVIDER_SITE_OTHER): Payer: Medicaid Other

## 2011-01-24 DIAGNOSIS — Z79899 Other long term (current) drug therapy: Secondary | ICD-10-CM

## 2011-01-24 DIAGNOSIS — B20 Human immunodeficiency virus [HIV] disease: Secondary | ICD-10-CM

## 2011-01-24 LAB — COMPREHENSIVE METABOLIC PANEL
ALT: 25 U/L (ref 0–53)
AST: 28 U/L (ref 0–37)
Albumin: 4.2 g/dL (ref 3.5–5.2)
Alkaline Phosphatase: 76 U/L (ref 39–117)
BUN: 13 mg/dL (ref 6–23)
Potassium: 4.6 mEq/L (ref 3.5–5.3)
Sodium: 139 mEq/L (ref 135–145)
Total Protein: 8.2 g/dL (ref 6.0–8.3)

## 2011-01-24 LAB — CBC WITH DIFFERENTIAL/PLATELET
Basophils Absolute: 0 10*3/uL (ref 0.0–0.1)
Basophils Relative: 0 % (ref 0–1)
MCHC: 32.8 g/dL (ref 30.0–36.0)
Neutro Abs: 6.7 10*3/uL (ref 1.7–7.7)
Neutrophils Relative %: 70 % (ref 43–77)
Platelets: 209 10*3/uL (ref 150–400)
RDW: 13.5 % (ref 11.5–15.5)
WBC: 9.6 10*3/uL (ref 4.0–10.5)

## 2011-01-24 LAB — LIPID PANEL
Cholesterol: 155 mg/dL (ref 0–200)
Total CHOL/HDL Ratio: 3.8 Ratio
VLDL: 13 mg/dL (ref 0–40)

## 2011-01-24 MED ORDER — DAPSONE 100 MG PO TABS: 100.0000 mg | ORAL_TABLET | Freq: Every day | ORAL | Status: DC

## 2011-01-24 MED ORDER — EMTRICITABINE-TENOFOVIR DF 200-300 MG PO TABS: 1.0000 | ORAL_TABLET | Freq: Every day | ORAL | Status: DC

## 2011-01-24 MED ORDER — RALTEGRAVIR POTASSIUM 400 MG PO TABS: 400.0000 mg | ORAL_TABLET | Freq: Two times a day (BID) | ORAL | Status: DC

## 2011-01-25 LAB — T-HELPER CELL (CD4) - (RCID CLINIC ONLY): CD4 T Cell Abs: 160 uL — ABNORMAL LOW (ref 400–2700)

## 2011-01-26 ENCOUNTER — Ambulatory Visit: Payer: Self-pay | Admitting: Adult Health

## 2011-02-02 ENCOUNTER — Ambulatory Visit: Payer: Medicaid Other | Admitting: Adult Health

## 2011-02-06 ENCOUNTER — Ambulatory Visit: Payer: Self-pay | Admitting: Adult Health

## 2011-03-01 ENCOUNTER — Emergency Department (HOSPITAL_COMMUNITY)
Admission: EM | Admit: 2011-03-01 | Discharge: 2011-03-01 | Disposition: A | Discharge status: Home / Self Care | Payer: Medicaid Other | Attending: Emergency Medicine | Admitting: Emergency Medicine

## 2011-03-01 DIAGNOSIS — J329 Chronic sinusitis, unspecified: Secondary | ICD-10-CM | POA: Insufficient documentation

## 2011-03-01 DIAGNOSIS — J3489 Other specified disorders of nose and nasal sinuses: Secondary | ICD-10-CM | POA: Insufficient documentation

## 2011-03-01 DIAGNOSIS — H9209 Otalgia, unspecified ear: Secondary | ICD-10-CM | POA: Insufficient documentation

## 2011-03-01 DIAGNOSIS — H9210 Otorrhea, unspecified ear: Secondary | ICD-10-CM | POA: Insufficient documentation

## 2011-03-01 DIAGNOSIS — Z79899 Other long term (current) drug therapy: Secondary | ICD-10-CM | POA: Insufficient documentation

## 2011-03-01 DIAGNOSIS — F319 Bipolar disorder, unspecified: Secondary | ICD-10-CM | POA: Insufficient documentation

## 2011-03-01 DIAGNOSIS — R51 Headache: Secondary | ICD-10-CM | POA: Insufficient documentation

## 2011-03-01 DIAGNOSIS — Z8659 Personal history of other mental and behavioral disorders: Secondary | ICD-10-CM | POA: Insufficient documentation

## 2011-03-27 ENCOUNTER — Other Ambulatory Visit: Payer: Medicaid Other

## 2011-03-28 ENCOUNTER — Emergency Department (HOSPITAL_COMMUNITY): Payer: Medicaid Other

## 2011-03-28 ENCOUNTER — Emergency Department (HOSPITAL_COMMUNITY)
Admission: EM | Admit: 2011-03-28 | Discharge: 2011-03-28 | Disposition: A | Discharge status: Home / Self Care | Payer: Medicaid Other | Attending: Emergency Medicine | Admitting: Emergency Medicine

## 2011-03-28 DIAGNOSIS — M25569 Pain in unspecified knee: Secondary | ICD-10-CM | POA: Insufficient documentation

## 2011-03-28 DIAGNOSIS — E550 Rickets, active: Secondary | ICD-10-CM | POA: Insufficient documentation

## 2011-03-28 DIAGNOSIS — Z21 Asymptomatic human immunodeficiency virus [HIV] infection status: Secondary | ICD-10-CM | POA: Insufficient documentation

## 2011-03-28 DIAGNOSIS — Z79899 Other long term (current) drug therapy: Secondary | ICD-10-CM | POA: Insufficient documentation

## 2011-03-28 DIAGNOSIS — Z8659 Personal history of other mental and behavioral disorders: Secondary | ICD-10-CM | POA: Insufficient documentation

## 2011-04-10 ENCOUNTER — Ambulatory Visit: Payer: Medicaid Other | Admitting: Adult Health

## 2011-04-17 ENCOUNTER — Ambulatory Visit (INDEPENDENT_AMBULATORY_CARE_PROVIDER_SITE_OTHER): Payer: Medicaid Other | Admitting: Adult Health

## 2011-04-17 DIAGNOSIS — M25569 Pain in unspecified knee: Secondary | ICD-10-CM

## 2011-04-17 DIAGNOSIS — Z23 Encounter for immunization: Secondary | ICD-10-CM

## 2011-04-17 DIAGNOSIS — M25561 Pain in right knee: Secondary | ICD-10-CM

## 2011-04-17 DIAGNOSIS — Z79899 Other long term (current) drug therapy: Secondary | ICD-10-CM

## 2011-04-17 DIAGNOSIS — Z113 Encounter for screening for infections with a predominantly sexual mode of transmission: Secondary | ICD-10-CM

## 2011-04-17 DIAGNOSIS — B20 Human immunodeficiency virus [HIV] disease: Secondary | ICD-10-CM

## 2011-04-17 LAB — COMPLETE METABOLIC PANEL WITH GFR
Alkaline Phosphatase: 85 U/L (ref 39–117)
BUN: 10 mg/dL (ref 6–23)
Creat: 1.05 mg/dL (ref 0.50–1.35)
GFR, Est African American: >60 mL/min (ref >60)
GFR, Est Non African American: >60 mL/min (ref >60)
Glucose, Bld: 85 mg/dL (ref 70–99)
Sodium: 141 mEq/L (ref 135–145)
Total Bilirubin: 0.3 mg/dL (ref 0.3–1.2)

## 2011-04-17 LAB — MICROALBUMIN / CREATININE URINE RATIO
Creatinine, Urine: 123.6 mg/dL
Microalb Creat Ratio: 4 mg/g (ref 0.0–30.0)

## 2011-04-17 LAB — URINALYSIS, ROUTINE W REFLEX MICROSCOPIC
Glucose, UA: NEGATIVE mg/dL
Hgb urine dipstick: NEGATIVE
Ketones, ur: NEGATIVE mg/dL
Leukocytes, UA: NEGATIVE
Nitrite: NEGATIVE
Specific Gravity, Urine: 1.019 (ref 1.005–1.030)
pH: 7 (ref 5.0–8.0)

## 2011-04-17 LAB — LIPID PANEL
Cholesterol: 145 mg/dL (ref 0–200)
Total CHOL/HDL Ratio: 3.1 Ratio
Triglycerides: 88 mg/dL (ref <150)
VLDL: 18 mg/dL (ref 0–40)

## 2011-04-17 LAB — CBC WITH DIFFERENTIAL/PLATELET
Basophils Relative: 0 % (ref 0–1)
Eosinophils Absolute: 0.1 10*3/uL (ref 0.0–0.7)
Eosinophils Relative: 2 % (ref 0–5)
Hemoglobin: 14.5 g/dL (ref 13.0–17.0)
MCH: 32.8 pg (ref 26.0–34.0)
MCHC: 34.3 g/dL (ref 30.0–36.0)
MCV: 95.7 fL (ref 78.0–100.0)
Monocytes Absolute: 0.4 10*3/uL (ref 0.1–1.0)
Monocytes Relative: 7 % (ref 3–12)
Neutrophils Relative %: 70 % (ref 43–77)

## 2011-04-17 LAB — RPR

## 2011-04-18 LAB — HIV-1 RNA QUANT-NO REFLEX-BLD
HIV 1 RNA Quant: <20 copies/mL (ref <20)
HIV-1 RNA Quant, Log: <1.3 {Log} (ref <1.30)

## 2011-04-18 LAB — GC/CHLAMYDIA PROBE AMP, URINE
Chlamydia, Swab/Urine, PCR: NEGATIVE
GC Probe Amp, Urine: NEGATIVE

## 2011-04-18 LAB — T-HELPER CELL (CD4) - (RCID CLINIC ONLY): CD4 T Cell Abs: 140 uL — ABNORMAL LOW (ref 400–2700)

## 2011-06-04 ENCOUNTER — Ambulatory Visit: Payer: Medicaid Other | Admitting: Infectious Disease

## 2011-07-02 ENCOUNTER — Telehealth: Payer: Self-pay | Admitting: *Deleted

## 2011-07-02 NOTE — Telephone Encounter (Signed)
Pt came in asking for a letter stating he is HIV positive & being seen here for that problem. He needs this for court 07/19/11 for a child support case. States his lawyer wants this. [place] Spaniel it is ok to disclose this info. I had him sign a release of info. I called the lawyer Jasper Loser in [place], [place] [phone] & fax [phone]. They just need something that shows he has HIV. I printed the snapshot & faxed it. Again, pt says it is ok to disclose this

## 2011-07-18 ENCOUNTER — Other Ambulatory Visit: Payer: Self-pay | Admitting: Infectious Diseases

## 2011-07-18 ENCOUNTER — Other Ambulatory Visit (INDEPENDENT_AMBULATORY_CARE_PROVIDER_SITE_OTHER): Payer: Medicaid Other

## 2011-07-18 DIAGNOSIS — B20 Human immunodeficiency virus [HIV] disease: Secondary | ICD-10-CM

## 2011-07-18 LAB — CBC WITH DIFFERENTIAL/PLATELET
Basophils Absolute: 0 10*3/uL (ref 0.0–0.1)
Basophils Relative: 0 % (ref 0–1)
HCT: 40.5 % (ref 39.0–52.0)
Lymphocytes Relative: 23 % (ref 12–46)
Monocytes Absolute: 0.4 10*3/uL (ref 0.1–1.0)
Neutro Abs: 4.4 10*3/uL (ref 1.7–7.7)
Neutrophils Relative %: 69 % (ref 43–77)
RDW: 13.9 % (ref 11.5–15.5)
WBC: 6.4 10*3/uL (ref 4.0–10.5)

## 2011-07-18 LAB — COMPREHENSIVE METABOLIC PANEL
ALT: 20 U/L (ref 0–53)
AST: 23 U/L (ref 0–37)
Albumin: 4.1 g/dL (ref 3.5–5.2)
Alkaline Phosphatase: 84 U/L (ref 39–117)
BUN: 12 mg/dL (ref 6–23)
CO2: 27 mEq/L (ref 19–32)
Calcium: 8.9 mg/dL (ref 8.4–10.5)
Chloride: 108 mEq/L (ref 96–112)
Creat: 1.3 mg/dL (ref 0.50–1.35)
Glucose, Bld: 80 mg/dL (ref 70–99)
Potassium: 4 mEq/L (ref 3.5–5.3)
Sodium: 141 mEq/L (ref 135–145)
Total Bilirubin: 0.6 mg/dL (ref 0.3–1.2)
Total Protein: 6.8 g/dL (ref 6.0–8.3)

## 2011-08-01 ENCOUNTER — Telehealth: Payer: Self-pay | Admitting: *Deleted

## 2011-08-01 ENCOUNTER — Ambulatory Visit: Payer: Medicaid Other | Admitting: Infectious Diseases

## 2011-08-01 NOTE — Telephone Encounter (Signed)
I left him a message asking him to call & reschedule

## 2011-08-22 ENCOUNTER — Encounter: Payer: Self-pay | Admitting: Infectious Diseases

## 2011-08-22 ENCOUNTER — Ambulatory Visit (INDEPENDENT_AMBULATORY_CARE_PROVIDER_SITE_OTHER): Payer: Medicaid Other | Admitting: Infectious Diseases

## 2011-08-22 DIAGNOSIS — Z0000 Encounter for general adult medical examination without abnormal findings: Secondary | ICD-10-CM

## 2011-08-22 DIAGNOSIS — Z23 Encounter for immunization: Secondary | ICD-10-CM

## 2011-08-22 DIAGNOSIS — Z79899 Other long term (current) drug therapy: Secondary | ICD-10-CM

## 2011-08-22 DIAGNOSIS — B20 Human immunodeficiency virus [HIV] disease: Secondary | ICD-10-CM

## 2011-08-22 DIAGNOSIS — Z113 Encounter for screening for infections with a predominantly sexual mode of transmission: Secondary | ICD-10-CM

## 2011-08-22 DIAGNOSIS — B1710 Acute hepatitis C without hepatic coma: Secondary | ICD-10-CM

## 2011-08-22 NOTE — Assessment & Plan Note (Signed)
He feels like he has no health problems, is doing very well. States he had colonoscopy at [facility]. He is given condoms, vax (ex Hep A) appear to be up to date. Will get his [facility] record from [facility]. rtc 5-6 months with labs prior.

## 2011-08-22 NOTE — Progress Notes (Signed)
Addended by: Golden Circle A on: 08/22/2011 09:55 AM   Modules accepted: Orders

## 2011-08-22 NOTE — Progress Notes (Signed)
  Subjective:    Patient ID: Nathan Ewing, male    DOB: February 23, 1951, 61 y.o.   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 Taking ISN/TRV. Feels well. Without complaints.    Review of Systems  Constitutional: Negative for appetite change and unexpected weight change.  Respiratory: Negative for chest tightness and shortness of breath.   Cardiovascular: Negative for chest pain.  Gastrointestinal: Negative for diarrhea and constipation.  Neurological: Negative for headaches.       Objective:   Physical Exam  Constitutional: He appears well-developed and well-nourished.  Eyes: EOM are normal.  Neck: Neck supple.  Cardiovascular: Normal rate, regular rhythm and normal heart sounds.   Pulmonary/Chest: Effort normal and breath sounds normal.  Abdominal: Soft. Bowel sounds are normal. There is no tenderness.  Lymphadenopathy:    He has no cervical adenopathy.          Assessment & Plan:

## 2011-08-22 NOTE — Assessment & Plan Note (Signed)
Will start Hep A series. Will continue to monitor his LFTS.

## 2011-09-25 DIAGNOSIS — M25562 Pain in left knee: Secondary | ICD-10-CM | POA: Insufficient documentation

## 2011-09-25 NOTE — Assessment & Plan Note (Signed)
Subjectively and objectively. This is consistent with degenerative joint disease. Several measures more conservative should be applied first before further workup should be done. He may try NSAIDs, or moist heat with active assisted range of motion, less and his sedentary lifestyle, and should none of the use assist him, we will do radiographic studies, and, perhaps refer him to sports medicine.

## 2011-09-25 NOTE — Assessment & Plan Note (Signed)
No staging labs for quite some time. We'll have him have labs drawn today and have her return in 2 weeks for followup.

## 2011-09-25 NOTE — Progress Notes (Signed)
Subjective:  Presents with a chief complaint of aching pain to both knees, especially while ambulating for the past month, and not improving. However, he has done nothing to assist. This or improve the situation, nor has he had it evaluated until today. Denies any swelling, trauma, or other known injury. He does relate a sedentary lifestyle.  Review of Systems - General ROS: positive for  - fatigue Psychological ROS: negative Ophthalmic ROS: negative ENT ROS: negative Endocrine ROS: negative Respiratory ROS: no cough, shortness of breath, or wheezing Cardiovascular ROS: no chest pain or dyspnea on exertion Gastrointestinal ROS: no abdominal pain, change in bowel habits, or black or bloody stools Musculoskeletal ROS: positive for - gait disturbance, joint pain and joint stiffness Neurological ROS: no TIA or stroke symptoms Dermatological ROS: negative for pruritus, rash and skin lesion changes   Objective:    General appearance: alert and cooperative Head: Normocephalic, without obvious abnormality, atraumatic Eyes: conjunctivae/corneas clear. PERRL, EOM's intact. Fundi benign. Nose: Nares normal. Septum midline. Mucosa normal. No drainage or sinus tenderness. Throat: lips, mucosa, and tongue normal; teeth and gums normal Resp: clear to auscultation bilaterally Cardio: regular rate and rhythm, S1, S2 normal, no murmur, click, rub or gallop GI: soft, non-tender; bowel sounds normal; no masses,  no organomegaly Extremities: extremities normal, atraumatic, no cyanosis or edema Skin: Skin color, texture, turgor normal. No rashes or lesions Neurologic: Alert and oriented X 3, normal strength and tone. Normal symmetric reflexes. Normal coordination and gait Psych:  No vegetative signs or delusional behaviors noted.     Assessment/Plan:  Knee pain, bilateral Subjectively and objectively. This is consistent with degenerative joint disease. Several measures more conservative should be  applied first before further workup should be done. He may try NSAIDs, or moist heat with active assisted range of motion, less and his sedentary lifestyle, and should none of the use assist him, we will do radiographic studies, and, perhaps refer him to sports medicine.  HIV INFECTION No staging labs for quite some time. We'll have him have labs drawn today and have her return in 2 weeks for followup.      Saliha Salts A. Sundra Aland, MS, [facility] [phone]  09/25/2011,  10:04 PM

## 2011-10-02 ENCOUNTER — Other Ambulatory Visit: Payer: Self-pay

## 2011-10-02 ENCOUNTER — Encounter (HOSPITAL_COMMUNITY): Payer: Self-pay

## 2011-10-02 ENCOUNTER — Emergency Department (HOSPITAL_COMMUNITY)
Admission: EM | Admit: 2011-10-02 | Discharge: 2011-10-03 | Disposition: A | Discharge status: Home Health | Payer: Medicaid Other | Attending: Emergency Medicine | Admitting: Emergency Medicine

## 2011-10-02 ENCOUNTER — Emergency Department (HOSPITAL_COMMUNITY): Payer: Medicaid Other

## 2011-10-02 DIAGNOSIS — R0781 Pleurodynia: Secondary | ICD-10-CM

## 2011-10-02 DIAGNOSIS — Z79899 Other long term (current) drug therapy: Secondary | ICD-10-CM | POA: Insufficient documentation

## 2011-10-02 DIAGNOSIS — R071 Chest pain on breathing: Secondary | ICD-10-CM | POA: Insufficient documentation

## 2011-10-02 DIAGNOSIS — Z21 Asymptomatic human immunodeficiency virus [HIV] infection status: Secondary | ICD-10-CM | POA: Insufficient documentation

## 2011-10-02 LAB — DIFFERENTIAL
Lymphs Abs: 1.6 10*3/uL (ref 0.7–4.0)
Monocytes Absolute: 0.6 10*3/uL (ref 0.1–1.0)
Monocytes Relative: 6 % (ref 3–12)
Neutro Abs: 6.7 10*3/uL (ref 1.7–7.7)
Neutrophils Relative %: 74 % (ref 43–77)

## 2011-10-02 LAB — BASIC METABOLIC PANEL
BUN: 14 mg/dL (ref 6–23)
Chloride: 103 mEq/L (ref 96–112)
Glucose, Bld: 86 mg/dL (ref 70–99)
Potassium: 3.8 mEq/L (ref 3.5–5.1)

## 2011-10-02 LAB — CBC
HCT: 44 % (ref 39.0–52.0)
Hemoglobin: 15.2 g/dL (ref 13.0–17.0)
MCH: 32.5 pg (ref 26.0–34.0)
RBC: 4.68 MIL/uL (ref 4.22–5.81)

## 2011-10-02 MED ORDER — KETOROLAC TROMETHAMINE 30 MG/ML IJ SOLN
30.0000 mg | Freq: Once | INTRAMUSCULAR | Status: AC
  Administered 2011-10-02: 30 mg via INTRAVENOUS
  Filled 2011-10-02: qty 1

## 2011-10-02 MED ORDER — ASPIRIN 81 MG PO CHEW
324.0000 mg | CHEWABLE_TABLET | Freq: Once | ORAL | Status: AC
  Administered 2011-10-02: 324 mg via ORAL
  Filled 2011-10-02: qty 4

## 2011-10-02 NOTE — ED Notes (Signed)
Chest pain began this am,. Center of his chest radiates into lt. Arm intermittent pain in the lt. Arm, pt. Having periods of dizziness and the chest pain increases with deep breath.  Pt. Is sob , denies any vomiting, is nauseated

## 2011-10-02 NOTE — ED Provider Notes (Signed)
History     CSN: [id number]  Arrival date & time 10/02/11  1715   First MD Initiated Contact with Patient 10/02/11 2257      Chief Complaint  Patient presents with  . Chest Pain    (Consider location/radiation/quality/duration/timing/severity/associated sxs/prior treatment) Patient is a 61 y.o. male presenting with chest pain. The history is provided by the patient.  Chest Pain   He started having midsternal chest pain this morning. Pain is sharp and severe. She rates her pain at 8/10 but the worst pain was 10 out of 10. There is intermittent radiation of pain to his left arm. Pain is worse with deep breathing and moving in certain ways. It is better if he holds himself in certain positions. It is not affected by exertion. He has not had any fever or chills or sweats. There's been mild nausea and some dyspnea. He has had a mild nonproductive cough. He has not taken anything for the pain. He had a similar episode of pain several years ago and was hospitalized at [facility] but does not recall what his diagnosis was. He has been having episodes of dizziness which are worse when he stands up. 3 days ago he had a syncopal episode and states that he had loss of consciousness for over one hour.  Past Medical History  Diagnosis Date  . HIV infection     History reviewed. No pertinent past surgical history.  Family History  Problem Relation Age of Onset  . Heart disease Mother   . Heart disease Father     History  Substance Use Topics  . Smoking status: Never Smoker   . Smokeless tobacco: Never Used  . Alcohol Use: No      Review of Systems  Cardiovascular: Positive for chest pain.  All other systems reviewed and are negative.    Allergies  Quetiapine and Sulfamethoxazole w/trimethoprim  Home Medications   Current Outpatient Rx  Name Route Sig Dispense Refill  . DAPSONE 100 MG PO TABS Oral Take 100 mg by mouth daily.    Marland Kitchen EMTRICITABINE-TENOFOVIR 200-300  MG PO TABS Oral Take 1 tablet by mouth daily.    Marland Kitchen RALTEGRAVIR POTASSIUM 400 MG PO TABS Oral Take 400 mg by mouth 2 (two) times daily.      BP 112/76  Pulse 80  Temp(Src) 98.7 F (37.1 C) (Oral)  Resp 16  Ht 5\' 11"  (1.803 m)  Wt 221 lb (100.245 kg)  BMI 30.82 kg/m2  SpO2 94%  Physical Exam  Nursing note and vitals reviewed.  61 year old male who is resting comfortably and in no acute distress. Vital signs are normal. Oxygen saturation is 94% which is normal. Head is normocephalic and atraumatic. PERRLA, EOMI. Fundi show no hemorrhage, exudate, or papilledema. Oropharynx is clear. Neck is nontender and supple without adenopathy, JVD, or bruit. Back is nontender. Lungs are clear without rales, wheezes, or rhonchi. Heart has regular rate rhythm without murmur. There is rather well localized tenderness to palpation in the left parasternal area inferiorly. Abdomen is soft, flat, nontender without masses or hepatosplenomegaly. Extremities have 1+ edema, no cyanosis. Full range of motion is present. Skin is warm and dry without rash. Neurologic: Mental status is normal, cranial nerves are intact, there no focal motor or sensory deficits. He has no significant cardiac risk factors. He denies tobacco use, hypertension, diabetes, hyperlipidemia. There is a family history of coronary artery disease in his parents but onset was in their early 48s.  ED Course  Procedures (including critical care time)  Results for orders placed during the hospital encounter of 10/02/11  CBC      Component Value Range   WBC 9.1  4.0 - 10.5 (K/uL)   RBC 4.68  4.22 - 5.81 (MIL/uL)   Hemoglobin 15.2  13.0 - 17.0 (g/dL)   HCT 34.7  42.5 - 95.6 (%)   MCV 94.0  78.0 - 100.0 (fL)   MCH 32.5  26.0 - 34.0 (pg)   MCHC 34.5  30.0 - 36.0 (g/dL)   RDW 38.7  56.4 - 33.2 (%)   Platelets 150  150 - 400 (K/uL)  DIFFERENTIAL      Component Value Range   Neutrophils Relative 74  43 - 77 (%)   Neutro Abs 6.7  1.7 - 7.7 (K/uL)    Lymphocytes Relative 18  12 - 46 (%)   Lymphs Abs 1.6  0.7 - 4.0 (K/uL)   Monocytes Relative 6  3 - 12 (%)   Monocytes Absolute 0.6  0.1 - 1.0 (K/uL)   Eosinophils Relative 1  0 - 5 (%)   Eosinophils Absolute 0.1  0.0 - 0.7 (K/uL)   Basophils Relative 0  0 - 1 (%)   Basophils Absolute 0.0  0.0 - 0.1 (K/uL)  BASIC METABOLIC PANEL      Component Value Range   Sodium 138  135 - 145 (mEq/L)   Potassium 3.8  3.5 - 5.1 (mEq/L)   Chloride 103  96 - 112 (mEq/L)   CO2 27  19 - 32 (mEq/L)   Glucose, Bld 86  70 - 99 (mg/dL)   BUN 14  6 - 23 (mg/dL)   Creatinine, Ser 9.51  0.50 - 1.35 (mg/dL)   Calcium 9.4  8.4 - 88.4 (mg/dL)   GFR calc non Af Amer 61 (*) >90 (mL/min)   GFR calc Af Amer 71 (*) >90 (mL/min)  POCT I-STAT TROPONIN I      Component Value Range   Troponin i, poc 0.00  0.00 - 0.08 (ng/mL)   Comment 3           D-DIMER, QUANTITATIVE      Component Value Range   D-Dimer, Quant 0.69 (*) 0.00 - 0.48 (ug/mL-FEU)  TROPONIN I      Component Value Range   Troponin I <0.30  <0.30 (ng/mL)   Dg Chest 2 View  10/02/2011  *RADIOLOGY REPORT*  Clinical Data: 61 year old male with chest pain.  Fall.  CHEST - 2 VIEW  Comparison: 02/13/2010.  Findings: Lower lung volumes.  Cardiac size and mediastinal contours are within normal limits.  Visualized tracheal air column is within normal limits.  No pneumothorax, pulmonary edema, pleural effusion or consolidation.  Negative visualized bowel gas pattern. No acute osseous abnormality identified.  IMPRESSION: Low lung volumes, otherwise no acute cardiopulmonary abnormality.  Original Report Authenticated By: Harley Hallmark, M.D.   Ct Angio Chest W/cm &/or Wo Cm  10/03/2011  *RADIOLOGY REPORT*  Clinical Data: Chest pain, radiating into the left arm, worsened with deep breaths.  Shortness of breath and intermittent dizziness.  CT ANGIOGRAPHY CHEST  Technique:  Multidetector CT imaging of the chest using the standard protocol during bolus administration of  intravenous contrast. Multiplanar reconstructed images including MIPs were obtained and reviewed to evaluate the vascular anatomy.  Contrast: OMNIPAQUE IOHEXOL 350 MG/ML IV SOLN  Comparison: Chest radiograph performed 10/02/2011  Findings: There is no evidence of significant pulmonary embolus.  Minimal bilateral dependent subsegmental atelectasis is noted.  The lungs are otherwise clear.  There is no evidence of significant focal consolidation, pleural effusion or pneumothorax.  No masses are identified; no abnormal focal contrast enhancement is seen.  The mediastinum is unremarkable in appearance.  No mediastinal lymphadenopathy is seen.  No pericardial effusion is identified. The great vessels are unremarkable in appearance.  No axillary lymphadenopathy is seen.  The visualized portions of the thyroid gland are unremarkable in appearance.  The visualized portions of the liver and spleen are unremarkable. The visualized portions of the pancreas, gallbladder, stomach and adrenal glands are within normal limits.  Minimal nonspecific perinephric stranding is noted.  No acute osseous abnormalities are seen.  IMPRESSION:  1.  No evidence of significant pulmonary embolus. 2.  Minimal bilateral dependent subsegmental atelectasis noted; lungs otherwise clear.  Original Report Authenticated By: Tonia Ghent, M.D.     Date: 10/02/2011  Rate: 83  Rhythm: normal sinus rhythm  QRS Axis: normal  Intervals: normal  ST/T Wave abnormalities: normal  Conduction Disutrbances:none  Narrative Interpretation: Left ventricular hypertrophy, otherwise normal ECG. When compared with ECG of 02/13/2010, no significant changes are seen.  Old EKG Reviewed: unchanged  He got good relief of pain with IV Toradol. Workup was negative except for elevated d-dimer, and CT angiogram was negative. You'll be sent home with a prescription for naproxen 500 mg twice a day and is to followup with his PCP.  1. Pleuritic chest pain        MDM  Display which seems most likely to be musculoskeletal. D-dimer will be obtained to rule out pulmonary embolism. Cardiac markers will be checked as well as a chest x-ray will be given a therapeutic trial of Toradol as well as oral aspirin. I am not certain what his cause of syncope is, but orthostatic vital signs were be obtained. Since the syncopal episode occurred 3 days ago, I do not feel he has any indications for admission based on the syncope alone.        Dione Booze, MD 10/03/11 [phone]

## 2011-10-02 NOTE — ED Notes (Signed)
EKG completed at 17:25 in triage and transferred back to yellow with pt.

## 2011-10-02 NOTE — ED Notes (Signed)
Pt. Reports that he just moved his bowels and saw blood on the toilet papers.  No active bleeding noted

## 2011-10-03 ENCOUNTER — Emergency Department (HOSPITAL_COMMUNITY): Payer: Medicaid Other

## 2011-10-03 ENCOUNTER — Encounter (HOSPITAL_COMMUNITY): Payer: Self-pay | Admitting: Radiology

## 2011-10-03 LAB — D-DIMER, QUANTITATIVE: D-Dimer, Quant: 0.69 ug/mL-FEU — ABNORMAL HIGH (ref 0.00–0.48)

## 2011-10-03 LAB — TROPONIN I: Troponin I: <0.3 ng/mL (ref <0.30)

## 2011-10-03 MED ORDER — NAPROXEN 500 MG PO TABS: 500.0000 mg | ORAL_TABLET | Freq: Two times a day (BID) | ORAL | Status: DC

## 2011-10-03 MED ORDER — IOHEXOL 350 MG/ML SOLN
100.0000 mL | Freq: Once | INTRAVENOUS | Status: AC | PRN
  Administered 2011-10-03: 100 mL via INTRAVENOUS

## 2011-10-03 NOTE — ED Notes (Signed)
Followed up with CT regarding pt and they stated that he is next in line. Pt and Family updated on plan of care.

## 2011-10-03 NOTE — ED Notes (Signed)
Pt unhooked from monitor to go to CT

## 2011-10-03 NOTE — Discharge Instructions (Signed)
Chest Pain (Nonspecific) It is often hard to give a specific diagnosis for the cause of chest pain. There is always a chance that your pain could be related to something serious, such as a heart attack or a blood clot in the lungs. You need to follow up with your caregiver for further evaluation. CAUSES   Heartburn.   Pneumonia or bronchitis.   Anxiety and stress.   Inflammation around your heart (pericarditis) or lung (pleuritis or pleurisy).   A blood clot in the lung.   A collapsed lung (pneumothorax). It can develop suddenly on its own (spontaneous pneumothorax) or from injury (trauma) to the chest.  The chest wall is composed of bones, muscles, and cartilage. Any of these can be the source of the pain.  The bones can be bruised by injury.   The muscles or cartilage can be strained by coughing or overwork.   The cartilage can be affected by inflammation and become sore (costochondritis).  DIAGNOSIS  Lab tests or other studies, such as X-rays, an EKG, stress testing, or cardiac imaging, may be needed to find the cause of your pain.  TREATMENT   Treatment depends on what may be causing your chest pain. Treatment may include:   Acid blockers for heartburn.   Anti-inflammatory medicine.   Pain medicine for inflammatory conditions.   Antibiotics if an infection is present.   You may be advised to change lifestyle habits. This includes stopping smoking and avoiding caffeine and chocolate.   You may be advised to keep your head raised (elevated) when sleeping. This reduces the chance of acid going backward from your stomach into your esophagus.   Most of the time, nonspecific chest pain will improve within 2 to 3 days with rest and mild pain medicine.  HOME CARE INSTRUCTIONS   If antibiotics were prescribed, take the full amount even if you start to feel better.   For the next few days, avoid physical activities that bring on chest pain. Continue physical activities as  directed.   Do not smoke cigarettes or drink alcohol until your symptoms are gone.   Only take over-the-counter or prescription medicine for pain, discomfort, or fever as directed by your caregiver.   Follow your caregiver's suggestions for further testing if your chest pain does not go away.   Keep any follow-up appointments you made. If you do not go to an appointment, you could develop lasting (chronic) problems with pain. If there is any problem keeping an appointment, you must call to reschedule.  SEEK MEDICAL CARE IF:   You think you are having problems from the medicine you are taking. Read your medicine instructions carefully.   Your chest pain does not go away, even after treatment.   You develop a rash with blisters on your chest.  SEEK IMMEDIATE MEDICAL CARE IF:   You have increased chest pain or pain that spreads to your arm, neck, jaw, back, or belly (abdomen).   You develop shortness of breath, an increasing cough, or you are coughing up blood.   You have severe back or abdominal pain, feel sick to your stomach (nauseous) or throw up (vomit).   You develop severe weakness, fainting, or chills.   You have an oral temperature above 102 F (38.9 C), not controlled by medicine.  THIS IS AN EMERGENCY. Do not wait to see if the pain will go away. Get medical help at once. Call your local emergency services (911 in [place] Do not drive yourself to   the hospital. MAKE SURE YOU:   Understand these instructions.   Will watch your condition.   Will get help right away if you are not doing well or get worse.  Document Released: 05/09/2005 Document Revised: 04/11/2011 Document Reviewed: 03/04/2008 [facility] Patient Information 2012 [place], [place].  Naproxen and naproxen sodium oral immediate-release tablets What is this medicine? NAPROXEN (na PROX en) is a non-steroidal anti-inflammatory drug (NSAID). It is used to reduce swelling and to treat pain. This medicine may be used  for dental pain, headache, or painful monthly periods. It is also used for painful joint and muscular problems such as arthritis, tendinitis, bursitis, and gout. This medicine may be used for other purposes; ask your health care provider or pharmacist if you have questions. What should I tell my health care provider before I take this medicine? They need to know if you have any of these conditions: -asthma -cigarette smoker -drink more than 3 alcohol containing drinks a day -heart disease or circulation problems such as heart failure or leg edema (fluid retention) -high blood pressure -kidney disease -liver disease -stomach bleeding or ulcers -an unusual or allergic reaction to naproxen, aspirin, other NSAIDs, other medicines, foods, dyes, or preservatives -pregnant or trying to get pregnant -breast-feeding How should I use this medicine? Take this medicine by mouth with a glass of water. Follow the directions on the prescription label. Take it with food if your stomach gets upset. Try to not lie down for at least 10 minutes after you take it. Take your medicine at regular intervals. Do not take your medicine more often than directed. Long-term, continuous use may increase the risk of heart attack or stroke. A special MedGuide will be given to you by the pharmacist with each prescription and refill. Be sure to read this information carefully each time. Talk to your pediatrician regarding the use of this medicine in children. Special care may be needed. Overdosage: If you think you have taken too much of this medicine contact a poison control center or emergency room at once. NOTE: This medicine is only for you. Do not share this medicine with others. What if I miss a dose? If you miss a dose, take it as soon as you can. If it is almost time for your next dose, take only that dose. Do not take double or extra doses. What may interact with this  medicine? -alcohol -aspirin -cidofovir -diuretics -lithium -methotrexate -other drugs for inflammation like ketorolac or prednisone -pemetrexed -probenecid -warfarin This list may not describe all possible interactions. Give your health care provider a list of all the medicines, herbs, non-prescription drugs, or dietary supplements you use. Also tell them if you smoke, drink alcohol, or use illegal drugs. Some items may interact with your medicine. What should I watch for while using this medicine? Tell your doctor or health care professional if your pain does not get better. Talk to your doctor before taking another medicine for pain. Do not treat yourself. This medicine does not prevent heart attack or stroke. In fact, this medicine may increase the chance of a heart attack or stroke. The chance may increase with longer use of this medicine and in people who have heart disease. If you take aspirin to prevent heart attack or stroke, talk with your doctor or health care professional. Do not take other medicines that contain aspirin, ibuprofen, or naproxen with this medicine. Side effects such as stomach upset, nausea, or ulcers may be more likely to occur. Many medicines available  without a prescription should not be taken with this medicine. This medicine can cause ulcers and bleeding in the stomach and intestines at any time during treatment. Do not smoke cigarettes or drink alcohol. These increase irritation to your stomach and can make it more susceptible to damage from this medicine. Ulcers and bleeding can happen without warning symptoms and can cause death. You may get drowsy or dizzy. Do not drive, use machinery, or do anything that needs mental alertness until you know how this medicine affects you. Do not stand or sit up quickly, especially if you are an older patient. This reduces the risk of dizzy or fainting spells. This medicine can cause you to bleed more easily. Try to avoid damage  to your teeth and gums when you brush or floss your teeth. What side effects may I notice from receiving this medicine? Side effects that you should report to your doctor or health care professional as soon as possible: -black or bloody stools, blood in the urine or vomit -blurred vision -chest pain -difficulty breathing or wheezing -nausea or vomiting -severe stomach pain -skin rash, skin redness, blistering or peeling skin, hives, or itching -slurred speech or weakness on one side of the body -swelling of eyelids, throat, lips -unexplained weight gain or swelling -unusually weak or tired -yellowing of eyes or skin Side effects that usually do not require medical attention (report to your doctor or health care professional if they continue or are bothersome): -constipation -headache -heartburn This list may not describe all possible side effects. Call your doctor for medical advice about side effects. You may report side effects to FDA at 1-800-FDA-1088. Where should I keep my medicine? Keep out of the reach of children. Store at room temperature between 15 and 30 degrees C (59 and 86 degrees F). Keep container tightly closed. Throw away any unused medicine after the expiration date. NOTE: This sheet is a summary. It may not cover all possible information. If you have questions about this medicine, talk to your doctor, pharmacist, or health care provider.  2012, [employer]vier/[employer] Standard. (08/01/2009 8:10:16 PM)

## 2011-10-03 NOTE — ED Notes (Signed)
Received report from RN. Pt is not in any current respiratory distress. Pt stated that he is still having midsternal chest pain. Pain is not radiating. No n/v. Pain is 8 out of 10.Will continue to monitor.

## 2011-11-21 ENCOUNTER — Telehealth: Payer: Self-pay | Admitting: *Deleted

## 2011-11-21 NOTE — Telephone Encounter (Signed)
Patient called advised that she wants a referral to the dermatologist for dry skin. Advised him that we have not seen him for this issue and it may not be something to go to the dermatologist for. Advised him he will need to be seen by his provider here before we can refer him out. Transferred him to the front to make an appointment.

## 2011-11-22 ENCOUNTER — Encounter: Payer: Self-pay | Admitting: Internal Medicine

## 2011-11-22 ENCOUNTER — Ambulatory Visit (INDEPENDENT_AMBULATORY_CARE_PROVIDER_SITE_OTHER): Payer: Medicaid Other | Admitting: Internal Medicine

## 2011-11-22 VITALS — BP 139/90 | HR 69 | Temp 98.4°F | Ht 70.5 in | Wt 220.5 lb

## 2011-11-22 DIAGNOSIS — L309 Dermatitis, unspecified: Secondary | ICD-10-CM | POA: Insufficient documentation

## 2011-11-22 DIAGNOSIS — B20 Human immunodeficiency virus [HIV] disease: Secondary | ICD-10-CM

## 2011-11-22 DIAGNOSIS — L259 Unspecified contact dermatitis, unspecified cause: Secondary | ICD-10-CM

## 2011-11-22 DIAGNOSIS — I10 Essential (primary) hypertension: Secondary | ICD-10-CM | POA: Insufficient documentation

## 2011-11-22 LAB — COMPREHENSIVE METABOLIC PANEL
ALT: 20 U/L (ref 0–53)
AST: 29 U/L (ref 0–37)
Calcium: 9.5 mg/dL (ref 8.4–10.5)
Chloride: 107 mEq/L (ref 96–112)
Creat: 1.17 mg/dL (ref 0.50–1.35)
Potassium: 3.9 mEq/L (ref 3.5–5.3)
Sodium: 140 mEq/L (ref 135–145)

## 2011-11-22 LAB — CBC
MCV: 93.1 fL (ref 78.0–100.0)
Platelets: 170 10*3/uL (ref 150–400)
RDW: 12.7 % (ref 11.5–15.5)
WBC: 6.3 10*3/uL (ref 4.0–10.5)

## 2011-11-22 LAB — LIPID PANEL
HDL: 41 mg/dL (ref >39)
LDL Cholesterol: 97 mg/dL (ref 0–99)

## 2011-11-22 MED ORDER — DAPSONE 100 MG PO TABS
100.0000 mg | ORAL_TABLET | Freq: Every day | ORAL | Status: DC
Start: 2011-11-22 — End: 2012-01-21

## 2011-11-22 MED ORDER — EMTRICITABINE-TENOFOVIR DF 200-300 MG PO TABS: 1.0000 | ORAL_TABLET | Freq: Every day | ORAL | Status: DC

## 2011-11-22 MED ORDER — RALTEGRAVIR POTASSIUM 400 MG PO TABS: 400.0000 mg | ORAL_TABLET | Freq: Two times a day (BID) | ORAL | Status: DC

## 2011-11-22 MED ORDER — NYSTATIN-TRIAMCINOLONE 100000-0.1 UNIT/GM-% EX CREA: TOPICAL_CREAM | Freq: Four times a day (QID) | CUTANEOUS | Status: DC

## 2011-11-22 NOTE — Progress Notes (Signed)
Patient ID: Nathan Ewing, male   DOB: 1951-08-01, 61 y.o.   MRN: [id number]  INFECTIOUS DISEASE PROGRESS NOTE    Subjective: Nathan Ewing is in for a work in visit. Over the past 3 weeks he has developed an intensely pruritic rash around his neck. It has been unresponsive to a variety of topical creams and oral Benadryl. He is currently using Gold Bond cream without any relief. The rash has been persistent over the past 3 weeks. He does not wear any jewelry around his neck and does not know of using any new detergents or clothing that could have triggered an allergic reaction. He has never had any rash like this before.  He recalls missing only one dose of his HIV medicines in the past 6 months when he was out of town unexpectedly and did not have his medication with him.  Objective: Temp: 98.4 F (36.9 C) (04/11 1447) Temp src: Oral (04/11 1447) BP: 139/90 mmHg (04/11 1451) Pulse Rate: 69  (04/11 1451)  General: He appears slightly uncomfortable Skin: He has a hyperpigmented slightly roughened scaly rash all the way around his neck Lungs: Clear Cor: Regular S1 and S2 with no murmurs Abdomen: Nontender   Lab Results HIV 1 RNA Quant (copies/mL)  Date Value  07/18/2011 <20   04/17/2011 <20   01/24/2011 1080*     CD4 T Cell Abs (cmm)  Date Value  07/18/2011 180*  04/17/2011 140*  01/24/2011 160*     Assessment: I will need repeat lab work today but his HIV infection has been improving over the past year on his current regimen.  He appears to have some type of allergic dermatitis but I cannot rule out tinea infection. I will treat him empirically with topical nystatin and triamcinolone and see him back in one month.  He continues to have some moderate elevation of his blood pressure. I talked him about lifestyle modification and we'll see him back in one month  Plan: 1. Continue current antiretroviral medication 2. Nystatin triamcinolone cream 3. Lifestyle modification 4. Return in  one month   Cliffton Asters, MD [facility] [phone] pager   [phone] cell 11/22/2011, 3:09 PM

## 2011-11-22 NOTE — Progress Notes (Signed)
Addended by: Jennet Maduro D on: 11/22/2011 03:17 PM   Modules accepted: Orders

## 2011-11-23 LAB — T-HELPER CELL (CD4) - (RCID CLINIC ONLY): CD4 T Cell Abs: 180 uL — ABNORMAL LOW (ref 400–2700)

## 2011-11-26 LAB — HIV-1 RNA QUANT-NO REFLEX-BLD
HIV 1 RNA Quant: 58 copies/mL — ABNORMAL HIGH (ref <20)
HIV-1 RNA Quant, Log: 1.76 {Log} — ABNORMAL HIGH (ref <1.30)

## 2011-12-25 ENCOUNTER — Encounter: Payer: Self-pay | Admitting: *Deleted

## 2011-12-25 ENCOUNTER — Ambulatory Visit: Payer: Medicaid Other | Admitting: Internal Medicine

## 2011-12-25 ENCOUNTER — Telehealth: Payer: Self-pay | Admitting: *Deleted

## 2011-12-25 NOTE — Telephone Encounter (Signed)
Message left requesting pt to call for a new appt.

## 2011-12-27 ENCOUNTER — Telehealth: Payer: Self-pay | Admitting: *Deleted

## 2011-12-27 NOTE — Telephone Encounter (Signed)
Message left to call [facility] to make a new appt w/ Dr. Campbell. 

## 2012-01-01 ENCOUNTER — Ambulatory Visit: Payer: Medicaid Other | Attending: Orthopedic Surgery | Admitting: Physical Therapy

## 2012-01-21 ENCOUNTER — Encounter (HOSPITAL_COMMUNITY): Payer: Self-pay

## 2012-01-21 ENCOUNTER — Emergency Department (HOSPITAL_COMMUNITY)
Admission: EM | Admit: 2012-01-21 | Discharge: 2012-01-21 | Disposition: A | Discharge status: Home / Self Care | Payer: Medicaid Other | Attending: Emergency Medicine | Admitting: Emergency Medicine

## 2012-01-21 DIAGNOSIS — Z21 Asymptomatic human immunodeficiency virus [HIV] infection status: Secondary | ICD-10-CM | POA: Insufficient documentation

## 2012-01-21 DIAGNOSIS — K645 Perianal venous thrombosis: Secondary | ICD-10-CM | POA: Insufficient documentation

## 2012-01-21 LAB — COMPREHENSIVE METABOLIC PANEL
AST: 32 U/L (ref 0–37)
CO2: 28 mEq/L (ref 19–32)
Chloride: 104 mEq/L (ref 96–112)
Creatinine, Ser: 1.19 mg/dL (ref 0.50–1.35)
GFR calc Af Amer: 76 mL/min — ABNORMAL LOW (ref >90)
GFR calc non Af Amer: 65 mL/min — ABNORMAL LOW (ref >90)
Glucose, Bld: 86 mg/dL (ref 70–99)
Total Bilirubin: 0.4 mg/dL (ref 0.3–1.2)

## 2012-01-21 LAB — CBC
HCT: 42.6 % (ref 39.0–52.0)
Hemoglobin: 14.2 g/dL (ref 13.0–17.0)
RBC: 4.55 MIL/uL (ref 4.22–5.81)
RDW: 13.7 % (ref 11.5–15.5)
WBC: 7.8 10*3/uL (ref 4.0–10.5)

## 2012-01-21 LAB — DIFFERENTIAL
Basophils Absolute: 0 10*3/uL (ref 0.0–0.1)
Lymphocytes Relative: 19 % (ref 12–46)
Lymphs Abs: 1.4 10*3/uL (ref 0.7–4.0)
Monocytes Absolute: 0.6 10*3/uL (ref 0.1–1.0)
Monocytes Relative: 7 % (ref 3–12)
Neutro Abs: 5.7 10*3/uL (ref 1.7–7.7)

## 2012-01-21 MED ORDER — LIDOCAINE HCL 2 % EX GEL: Freq: Once | CUTANEOUS | Status: DC

## 2012-01-21 MED ORDER — LIDOCAINE HCL 2 % EX GEL: Freq: Three times a day (TID) | CUTANEOUS | Status: DC

## 2012-01-21 NOTE — ED Notes (Signed)
States had rectal bleed started  Last week and now having pain in abd rectum and between legs and more bleeding

## 2012-01-21 NOTE — Discharge Instructions (Signed)
Be sure to drink plenty of fluids. You can use Preparation H and Tucks to help with pain as well.  Hemorrhoidectomy Hemorrhoidectomy is surgery to remove hemorrhoids. Hemorrhoids are veins that have become swollen in the rectum. The rectum is the area from the bottom end of the intestines to the opening where bowel movements leave the body. Hemorrhoids can be uncomfortable. They can cause itching, bleeding and pain if a blood clot forms in them (thrombose). If hemorrhoids are small, surgery may not be needed. But if they cover a larger area, surgery is usually suggested.  LET YOUR CAREGIVER KNOW ABOUT:   Any allergies.   All medications you are taking, including:   Herbs, eyedrops, over-the-counter medications and creams.   Blood thinners (anticoagulants), aspirin or other drugs that could affect blood clotting.   Use of steroids (by mouth or as creams).   Previous problems with anesthetics, including local anesthetics.   Possibility of pregnancy, if this applies.   Any history of blood clots.   Any history of bleeding or other blood problems.   Previous surgery.   Smoking history.   Other health problems.  RISKS AND COMPLICATIONS All surgery carries some risk. However, hemorrhoid surgery usually goes smoothly. Possible complications could include:  Urinary retention.   Bleeding.   Infection.   A painful incision.   A reaction to the anesthesia (this is not common).  BEFORE THE PROCEDURE   Stop using aspirin and non-steroidal anti-inflammatory drugs (NSAIDs) for pain relief. This includes prescription drugs and over-the-counter drugs such as ibuprofen and naproxen. Also stop taking vitamin E. If possible, do this two weeks before your surgery.   If you take blood-thinners, ask your healthcare provider when you should stop taking them.   You will probably have blood and urine tests done several days before your surgery.   Do not eat or drink for about 8 hours before  the surgery.   Arrive at least an hour before the surgery, or whenever your surgeon recommends. This will give you time to check in and fill out any needed paperwork.   Hemorrhoidectomy is often an outpatient procedure. This means you will be able to go home the same day. Sometimes, though, people stay overnight in the hospital after the procedure. Ask your surgeon what to expect. Either way, make arrangements in advance for someone to drive you home.  PROCEDURE   The preparation:   You will change into a hospital gown.   You will be given an IV. A needle will be inserted in your arm. Medication can flow directly into your body through this needle.   You might be given an enema to clear your rectum.   Once in the operating room, you will probably lie on your side or be repositioned later to lying on your stomach.   You will be given anesthesia (medication) so you will not feel anything during the surgery. The surgery often is done with local anesthesia (the area near the hemorrhoids will be numb and you will be drowsy but awake). Sometimes, general anesthesia is used (you will be asleep during the procedure).   The procedure:   There are a few different procedures for hemorrhoids. Be sure to ask you surgeon about the procedure, the risks and benefits.   Be sure to ask about what you need to do to take care of the wound, if there is one.  AFTER THE PROCEDURE  You will stay in a recovery area until the anesthesia has  worn off. Your blood pressure and pulse will be checked every so often.   You may feel a lot of pain in the area of the rectum.   Take all pain medication prescribed by your surgeon. Ask before taking any over-the-counter pain medicines.   Sometimes sitting in a warm bath can help relieve your pain.   To make sure you have bowel movements without straining:   You will probably need to take stool softeners (usually a pill) for a few days.   You should drink 8 to 10  glasses of water each day.   Your activity will be restricted for awhile. Ask your caregiver for a list of what you should and should not do while you recover.  Document Released: 05/27/2009 Document Revised: 07/19/2011 Document Reviewed: 05/27/2009 [facility] Patient Information 2012 [place], [place].High Fiber Diet A high fiber diet changes your normal diet to include more whole grains, legumes, fruits, and vegetables. Changes in the diet involve replacing refined carbohydrates with unrefined foods. The calorie level of the diet is essentially unchanged. The Dietary Reference Intake (recommended amount) for adult males is 38 g per day. For adult females, it is 25 g per day. Pregnant and lactating women should consume 28 g of fiber per day. Fiber is the intact part of a plant that is not broken down during digestion. Functional fiber is fiber that has been isolated from the plant to provide a beneficial effect in the body. PURPOSE  Increase stool bulk.   Ease and regulate bowel movements.   Lower cholesterol.  INDICATIONS THAT YOU NEED MORE FIBER  Constipation and hemorrhoids.   Uncomplicated diverticulosis (intestine condition) and irritable bowel syndrome.   Weight management.   As a protective measure against hardening of the arteries (atherosclerosis), diabetes, and cancer.  NOTE OF CAUTION If you have a digestive or bowel problem, ask your caregiver for advice before adding high fiber foods to your diet. Some of the following medical problems are such that a high fiber diet should not be used without consulting your caregiver:  Acute diverticulitis (intestine infection).   Partial small bowel obstructions.   Complicated diverticular disease involving bleeding, rupture (perforation), or abscess (boil, furuncle).   Presence of autonomic neuropathy (nerve damage) or gastric paresis (stomach cannot empty itself).  GUIDELINES FOR INCREASING FIBER  Start adding fiber to the diet  slowly. A gradual increase of about 5 more grams (2 slices of whole-wheat bread, 2 servings of most fruits or vegetables, or 1 bowl of high fiber cereal) per day is best. Too rapid an increase in fiber may result in constipation, flatulence, and bloating.   Drink enough water and fluids to keep your urine clear or pale yellow. Water, juice, or caffeine-free drinks are recommended. Not drinking enough fluid may cause constipation.   Eat a variety of high fiber foods rather than one type of fiber.   Try to increase your intake of fiber through using high fiber foods rather than fiber pills or supplements that contain small amounts of fiber.   The goal is to change the types of food eaten. Do not supplement your present diet with high fiber foods, but replace foods in your present diet.  INCLUDE A VARIETY OF FIBER SOURCES  Replace refined and processed grains with whole grains, canned fruits with fresh fruits, and incorporate other fiber sources. White rice, white breads, and most bakery goods contain little or no fiber.   Brown whole-grain rice, buckwheat oats, and many fruits and vegetables are  all good sources of fiber. These include: broccoli, Brussels sprouts, cabbage, cauliflower, beets, sweet potatoes, white potatoes (skin on), carrots, tomatoes, eggplant, squash, berries, fresh fruits, and dried fruits.   Cereals appear to be the richest source of fiber. Cereal fiber is found in whole grains and bran. Bran is the fiber-rich outer coat of cereal grain, which is largely removed in refining. In whole-grain cereals, the bran remains. In breakfast cereals, the largest amount of fiber is found in those with "bran" in their names. The fiber content is sometimes indicated on the label.   You may need to include additional fruits and vegetables each day.   In baking, for 1 cup white flour, you may use the following substitutions:   1 cup whole-wheat flour minus 2 tbs.    cup white flour plus   cup whole-wheat flour.  Document Released: 07/30/2005 Document Revised: 07/19/2011 Document Reviewed: 06/07/2009 [facility] Patient Information 2012 [place], [place].Hemorrhoids Hemorrhoids are enlarged (dilated) veins around the rectum. There are 2 types of hemorrhoids, and the type of hemorrhoid is determined by its location. Internal hemorrhoids occur in the veins just inside the rectum.They are usually not painful, but they may bleed.However, they may poke through to the outside and become irritated and painful. External hemorrhoids involve the veins outside the anus and can be felt as a painful swelling or hard lump near the anus.They are often itchy and may crack and bleed. Sometimes clots will form in the veins. This makes them swollen and painful. These are called thrombosed hemorrhoids. CAUSES Causes of hemorrhoids include:  Pregnancy. This increases the pressure in the hemorrhoidal veins.   Constipation.   Straining to have a bowel movement.   Obesity.   Heavy lifting or other activity that caused you to strain.  TREATMENT Most of the time hemorrhoids improve in 1 to 2 weeks. However, if symptoms do not seem to be getting better or if you have a lot of rectal bleeding, your caregiver may perform a procedure to help make the hemorrhoids get smaller or remove them completely.Possible treatments include:  Rubber band ligation. A rubber band is placed at the base of the hemorrhoid to cut off the circulation.   Sclerotherapy. A chemical is injected to shrink the hemorrhoid.   Infrared light therapy. Tools are used to burn the hemorrhoid.   Hemorrhoidectomy. This is surgical removal of the hemorrhoid.  HOME CARE INSTRUCTIONS   Increase fiber in your diet. Ask your caregiver about using fiber supplements.   Drink enough water and fluids to keep your urine clear or pale yellow.   Exercise regularly.   Go to the bathroom when you have the urge to have a bowel movement. Do not  wait.   Avoid straining to have bowel movements.   Keep the anal area dry and clean.   Only take over-the-counter or prescription medicines for pain, discomfort, or fever as directed by your caregiver.  If your hemorrhoids are thrombosed:  Take warm sitz baths for 20 to 30 minutes, 3 to 4 times per day.   If the hemorrhoids are very tender and swollen, place ice packs on the area as tolerated. Using ice packs between sitz baths may be helpful. Fill a plastic bag with ice. Place a towel between the bag of ice and your skin.   Medicated creams and suppositories may be used or applied as directed.   Do not use a donut-shaped pillow or sit on the toilet for long periods. This increases blood pooling and  pain.  SEEK MEDICAL CARE IF:   You have increasing pain and swelling that is not controlled with your medicine.   You have uncontrolled bleeding.   You have difficulty or you are unable to have a bowel movement.   You have pain or inflammation outside the area of the hemorrhoids.   You have chills or an oral temperature above 102 F (38.9 C).  MAKE SURE YOU:   Understand these instructions.   Will watch your condition.   Will get help right away if you are not doing well or get worse.  Document Released: 07/27/2000 Document Revised: 07/19/2011 Document Reviewed: 12/02/2007 [facility] Patient Information 2012 [place], [place].

## 2012-01-21 NOTE — ED Provider Notes (Signed)
History     CSN: [id number]  Arrival date & time 01/21/12  1350   First MD Initiated Contact with Patient 01/21/12 1631      Chief Complaint  Patient presents with  . Rectal Bleeding    (Consider location/radiation/quality/duration/timing/severity/associated sxs/prior treatment) HPI  Patient presents to the ED with complaints of rectal pain and rectal bleeding that has been persisting for the past couple of weeks. He notices blood when he wipes and also notices blood in the toilet bowel sometimes. He also describes feeling "a piece of meat" hanging out of his rectum. He denies having a history of hemorrhoids. Pt denies feeling dizzy or having rectal bleeding not associated with bathroom use. Denies abdominal pain, nausea, vomiting, diarrhea, or recent weight loss, fevers or chils  Past Medical History  Diagnosis Date  . HIV infection     Past Surgical History  Procedure Date  . Knee surgery     Family History  Problem Relation Age of Onset  . Heart disease Mother   . Heart disease Father     History  Substance Use Topics  . Smoking status: Never Smoker   . Smokeless tobacco: Never Used  . Alcohol Use: No      Review of Systems   HEENT: denies blurry vision or change in hearing PULMONARY: Denies difficulty breathing and SOB CARDIAC: denies chest pain or heart palpitations MUSCULOSKELETAL:  denies being unable to ambulate ABDOMEN AL: denies abdominal pain GU: denies loss of bowel or urinary control NEURO: denies numbness and tingling in extremities SKIN: no new rashes PSYCH: patient behavior is normal NECK: Not complaining of neck pain     Allergies  Quetiapine and Sulfamethoxazole w-trimethoprim  Home Medications   Current Outpatient Rx  Name Route Sig Dispense Refill  . ARIPIPRAZOLE 20 MG PO TABS Oral Take 20 mg by mouth at bedtime.    Marland Kitchen DAPSONE 100 MG PO TABS Oral Take 100 mg by mouth at bedtime.    Marland Kitchen EMTRICITABINE-TENOFOVIR 200-300 MG PO TABS  Oral Take 1 tablet by mouth at bedtime.    Marland Kitchen ESCITALOPRAM OXALATE 20 MG PO TABS Oral Take 20 mg by mouth at bedtime.    Marland Kitchen RALTEGRAVIR POTASSIUM 400 MG PO TABS Oral Take 800 mg by mouth at bedtime.    Marland Kitchen LIDOCAINE HCL 2 % EX GEL Topical Apply topically 3 (three) times daily. Apply to affected area 30 mL 0    BP 127/92  Pulse 63  Temp(Src) 97.9 F (36.6 C) (Oral)  Resp 16  SpO2 96%  Physical Exam  Nursing note and vitals reviewed. Constitutional: He appears well-developed and well-nourished. No distress.  HENT:  Head: Normocephalic and atraumatic.  Eyes: Pupils are equal, round, and reactive to light.  Neck: Normal range of motion. Neck supple.  Cardiovascular: Normal rate and regular rhythm.   Pulmonary/Chest: Effort normal.  Abdominal: Soft.  Genitourinary: Rectal exam shows external hemorrhoid and tenderness. Rectal exam shows no fissure and anal tone normal. Guaiac positive stool.  Neurological: He is alert.  Skin: Skin is warm and dry.    ED Course  Procedures (including critical care time)  Labs Reviewed  COMPREHENSIVE METABOLIC PANEL - Abnormal; Notable for the following:    GFR calc non Af Amer 65 (*)    GFR calc Af Amer 76 (*)    All other components within normal limits  CBC  DIFFERENTIAL  OCCULT BLOOD, POC DEVICE  OCCULT BLOOD X 1 CARD TO LAB, STOOL   No results  found.   1. Hemorrhoids, external, thrombosed       MDM  Patient has multiple thrombosed external  hemorrhoids. Given lidocaine jelly in ED. Advised that he can use Preperation H or Tucks.  Hemoglobin is 14.2 and ORthostatics are physiologic.  Patient plan is to follow-up with GI, drink plenty of water and take stool softner to help with pain and bleeding.  Pt has been advised of the symptoms that warrant their return to the ED. Patient has voiced understanding and has agreed to follow-up with the PCP or specialist.        Dorthula Matas, PA 01/21/12 1812

## 2012-01-22 ENCOUNTER — Other Ambulatory Visit: Payer: Self-pay | Admitting: Licensed Clinical Social Worker

## 2012-01-22 ENCOUNTER — Telehealth: Payer: Self-pay | Admitting: Licensed Clinical Social Worker

## 2012-01-22 DIAGNOSIS — K649 Unspecified hemorrhoids: Secondary | ICD-10-CM

## 2012-01-22 NOTE — ED Provider Notes (Signed)
Medical screening examination/treatment/procedure(s) were performed by non-physician practitioner and as supervising physician I was immediately available for consultation/collaboration.   Conner Neiss, MD 01/22/12 0138 

## 2012-01-22 NOTE — Telephone Encounter (Signed)
  Patient called stating he went to the ED for Thrombosed Hemorrhoids, and the attending physician told him to contact us for a referral to a surgeon. After reviewing the discharge summary from the ED, I called [facility] and I got an appointment for tomorrow 01/23/2012 at 4:15pm. Patient aware.

## 2012-01-23 ENCOUNTER — Encounter (INDEPENDENT_AMBULATORY_CARE_PROVIDER_SITE_OTHER): Payer: Self-pay | Admitting: General Surgery

## 2012-01-24 ENCOUNTER — Ambulatory Visit (INDEPENDENT_AMBULATORY_CARE_PROVIDER_SITE_OTHER): Payer: Medicaid Other | Admitting: Surgery

## 2012-01-24 ENCOUNTER — Encounter (INDEPENDENT_AMBULATORY_CARE_PROVIDER_SITE_OTHER): Payer: Self-pay | Admitting: Surgery

## 2012-01-24 VITALS — BP 121/79 | HR 86 | Temp 98.3°F | Resp 12 | Ht 71.0 in | Wt 209.4 lb

## 2012-01-24 DIAGNOSIS — A630 Anogenital (venereal) warts: Secondary | ICD-10-CM

## 2012-01-24 HISTORY — DX: Anogenital (venereal) warts: A63.0

## 2012-01-24 MED ORDER — HYDROCORTISONE ACE-PRAMOXINE 2.5-1 % RE CREA: TOPICAL_CREAM | Freq: Four times a day (QID) | RECTAL | Status: AC

## 2012-01-24 NOTE — Progress Notes (Signed)
Subjective:     Patient ID: Nathan Ewing, male   DOB: Sep 04, 1950, 61 y.o.   MRN: [id number]  HPI  Nathan Ewing  1950/10/18 [id number]  Patient Care Team: Carolin Guernsey, NP as PCP - General (Infectious Diseases) Cliffton Asters, MD as PCP - Infectious Diseases (Infectious Diseases)  This patient is a 61 y.o.male who presents today for surgical evaluation at the request of [facility] ED (Dr Preston Fleeting & Marlon Pel, PA).   Reason for visit: Perianal pain. Question of hemorrhoids.  Patient is an HIV positive male. Recently started on retrovirals a few years ago. No definite AIDS.  He noted perianal pain a few weeks ago. It is worsened. Some blood when he wipes. He has a bowel movement about every other day. Number had any anorectal interventions. Feels like there maybe a lump out as well.  He went to the emergency room a few days ago. They were concerned about external hemorrhoids. He was given lidocaine gel. Was recommended a followup with gastroenterology. He called infectious disease. They recommended surgical evaluation. He notes the lidocaine jelly helps a little bit but still he has discomfort.  Patient Active Problem List  Diagnosis  . HIV INFECTION  . HEPATITIS C  . Knee pain, bilateral  . Hypertension  . Dermatitis  . Condyloma acuminatum - perianal    Past Medical History  Diagnosis Date  . HIV infection   . HIV (human immunodeficiency virus infection)   . Condyloma acuminatum - perianal 01/24/2012    Past Surgical History  Procedure Date  . Knee surgery     History   Social History  . Marital Status: Widowed    Spouse Name: N/A    Number of Children: N/A  . Years of Education: N/A   Occupational History  . Not on file.   Social History Main Topics  . Smoking status: Never Smoker   . Smokeless tobacco: Never Used  . Alcohol Use: No  . Drug Use: No  . Sexually Active: No     accepted condoms   Other Topics Concern  . Not on file   Social History  Narrative  . No narrative on file    Family History  Problem Relation Age of Onset  . Heart disease Mother   . Heart disease Father     Current Outpatient Prescriptions  Medication Sig Dispense Refill  . ARIPiprazole (ABILIFY) 20 MG tablet Take 20 mg by mouth at bedtime.      . dapsone 100 MG tablet Take 100 mg by mouth at bedtime.      Marland Kitchen emtricitabine-tenofovir (TRUVADA) 200-300 MG per tablet Take 1 tablet by mouth at bedtime.      Marland Kitchen escitalopram (LEXAPRO) 20 MG tablet Take 20 mg by mouth at bedtime.      . lidocaine (XYLOCAINE JELLY) 2 % jelly Apply topically 3 (three) times daily. Apply to affected area  30 mL  0  . raltegravir (ISENTRESS) 400 MG tablet Take 800 mg by mouth at bedtime.         Allergies  Allergen Reactions  . Quetiapine     REACTION: Sever reaction  . Sulfamethoxazole W-Trimethoprim Other (See Comments)    unknown    BP 121/79  Pulse 86  Temp 98.3 F (36.8 C) (Temporal)  Resp 12  Ht 5\' 11"  (1.803 m)  Wt 209 lb 6.4 oz (94.983 kg)  BMI 29.21 kg/m2  No results found.   Review of Systems  Constitutional: Negative for  fever, chills and diaphoresis.  HENT: Negative for nosebleeds, sore throat, facial swelling, mouth sores, trouble swallowing and ear discharge.   Eyes: Negative for photophobia, discharge and visual disturbance.  Respiratory: Negative for choking, chest tightness, shortness of breath and stridor.   Cardiovascular: Positive for leg swelling. Negative for chest pain and palpitations.  Gastrointestinal: Positive for anal bleeding and rectal pain. Negative for nausea, vomiting, abdominal pain, diarrhea, constipation, blood in stool and abdominal distention.       No personal nor family history of GI/colon cancer, inflammatory bowel disease, irritable bowel syndrome, allergy such as Celiac Sprue, dietary/dairy problems, colitis, ulcers nor gastritis.    No recent sick contacts/gastroenteritis.  No travel outside the country.  No changes in  diet.    Genitourinary: Negative for dysuria, urgency, difficulty urinating and testicular pain.  Musculoskeletal: Negative for myalgias, back pain, arthralgias and gait problem.  Skin: Negative for color change, pallor, rash and wound.  Neurological: Negative for dizziness, speech difficulty, weakness, numbness and headaches.  Hematological: Negative for adenopathy. Does not bruise/bleed easily.  Psychiatric/Behavioral: Negative for hallucinations, confusion and agitation.       Objective:   Physical Exam  Constitutional: He is oriented to person, place, and time. He appears well-developed and well-nourished. No distress.  HENT:  Head: Normocephalic.  Mouth/Throat: Oropharynx is clear and moist. No oropharyngeal exudate.  Eyes: Conjunctivae and EOM are normal. Pupils are equal, round, and reactive to light. No scleral icterus.  Neck: Normal range of motion. Neck supple. No tracheal deviation present.  Cardiovascular: Normal rate, regular rhythm and intact distal pulses.   Pulmonary/Chest: Effort normal and breath sounds normal. No respiratory distress.  Abdominal: Soft. He exhibits no distension. There is no tenderness. Hernia confirmed negative in the right inguinal area and confirmed negative in the left inguinal area.  Genitourinary:       Exam done with assistance of male Medical Assistant in the room.  Perianal skin clean with good hygiene.  No pruritis. No pilonidal disease.  No fissure.  No abscess/fistula.   Normal sphincter tone.    Obvious perianal warts 1mm to 12mm in size (more on his right)  Musculoskeletal: Normal range of motion. He exhibits no tenderness.  Lymphadenopathy:    He has no cervical adenopathy.       Right: No inguinal adenopathy present.       Left: No inguinal adenopathy present.  Neurological: He is alert and oriented to person, place, and time. No cranial nerve deficit. He exhibits normal muscle tone. Coordination normal.  Skin: Skin is warm and  dry. No rash noted. He is not diaphoretic. No erythema. No pallor.  Psychiatric: He has a normal mood and affect. His behavior is normal. Judgment and thought content normal.       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  The pathophysiology of anorectal warts and differential diagnosis was discussed.  Natural history risks without surgery was discussed such as further growth and cancer.   I stressed the importance of office follow-up to catch early recurrence & minimize/halt progression of disease.  Interventions such as cauterization by topical agents were discussed.  The patient's symptoms are not adequately controlled by non-operative treatments.  I feel the risks & problems of no surgery outweigh the operative risks; therefore, I recommended surgery to  treat the anal warts by removal, ablation and/or cauterization.  Risks such as bleeding, infection, need for further treatment, heart attack, death, and other risks were discussed.   I noted a good likelihood this will help address the problem. Goals of post-operative recovery were discussed as well.  Possibility that this will not correct all symptoms was explained.  Post-operative pain, bleeding, constipation, and other problems after surgery were discussed.  We will work to minimize complications.   Educational handouts further explaining the pathology, treatment options, and bowel regimen were given as well.  Questions were answered.  The patient expresses understanding & wishes to proceed with surgery.

## 2012-01-24 NOTE — Patient Instructions (Addendum)
See the Handout(s) we gave you.  Consider surgery to remove the anal warts.  Please call our office at [phone] if you wish to schedule surgery or if you have further questions / concerns.   Genital Warts Genital warts are a sexually transmitted infection. They may appear as small bumps on the tissues of the genital area. CAUSES  Genital warts are caused by a virus called human papillomavirus (HPV). HPV is the most common sexually transmitted disease (STD) and infection of the sex organs. This infection is spread by having unprotected sex with an infected person. It can be spread by vaginal, anal, and oral sex. Many people do not know they are infected. They may be infected for years without problems. However, even if they do not have problems, they can unknowingly pass the infection to their sexual partners. SYMPTOMS   Itching and irritation in the genital area.   Warts that bleed.   Painful sexual intercourse.  DIAGNOSIS  Warts are usually recognized with the naked eye on the vagina, vulva, perineum, anus, and rectum. Certain tests can also diagnose genital warts, such as:  A Pap test.   A tissue sample (biopsy) exam.   Colposcopy. A magnifying tool is used to examine the vagina and cervix. The HPV cells will change color when certain solutions are used.  TREATMENT  Warts can be removed by:  Applying certain chemicals, such as cantharidin or podophyllin.   Liquid nitrogen freezing (cryotherapy).   Immunotherapy with candida or trichophyton injections.   Laser treatment.   Burning with an electrified probe (electrocautery).   Interferon injections.   Surgery.  PREVENTION  HPV vaccination can help prevent HPV infections that cause genital warts and that cause cancer of the cervix. It is recommended that the vaccination be given to people between the ages 63 to 3 years old. The vaccine might not work as well or might not work at all if you already have HPV. It should not  be given to pregnant women. HOME CARE INSTRUCTIONS   It is important to follow your caregiver's instructions. The warts will not go away without treatment. Repeat treatments are often needed to get rid of warts. Even after it appears that the warts are gone, the normal tissue underneath often remains infected.   Do not try to treat genital warts with medicine used to treat hand warts. This type of medicine is strong and can burn the skin in the genital area, causing more damage.   Tell your past and current sexual partner(s) that you have genital warts. They may be infected also and need treatment.   Avoid sexual contact while being treated.   Do not touch or scratch the warts. The infection may spread to other parts of your body.   Women with genital warts should have a cervical cancer check (Pap test) at least once a year. This type of cancer is slow-growing and can be cured if found early. Chances of developing cervical cancer are increased with HPV.   Inform your obstetrician about your warts in the event of pregnancy. This virus can be passed to the baby's respiratory tract. Discuss this with your caregiver.   Use a condom during sexual intercourse. Following treatment, the use of condoms will help prevent reinfection.   Ask your caregiver about using over-the-counter anti-itch creams.  SEEK MEDICAL CARE IF:   Your treated skin becomes red, swollen, or painful.   You have a fever.   You feel generally ill.  You feel little lumps in and around your genital area.   You are bleeding or have painful sexual intercourse.  MAKE SURE YOU:   Understand these instructions.   Will watch your condition.   Will get help right away if you are not doing well or get worse.  Document Released: 07/27/2000 Document Revised: 07/19/2011 Document Reviewed: 02/05/2011 [facility] Patient Information 2012 [place], [place].  GETTING TO GOOD BOWEL HEALTH. Irregular bowel habits such as constipation  and diarrhea can lead to many problems over time.  Having one soft bowel movement a day is the most important way to prevent further problems.  The anorectal canal is designed to handle stretching and feces to safely manage our ability to get rid of solid waste (feces, poop, stool) out of our body.  BUT, hard constipated stools can act like ripping concrete bricks and diarrhea can be a burning fire to this very sensitive area of our body, causing inflamed hemorrhoids, anal fissures, increasing risk is perirectal abscesses, abdominal pain/bloating, an making irritable bowel worse.     The goal: ONE SOFT BOWEL MOVEMENT A DAY!  To have soft, regular bowel movements:    Drink at least 8 tall glasses of water a day.     Take plenty of fiber.  Fiber is the undigested part of plant food that passes into the colon, acting s "natures broom" to encourage bowel motility and movement.  Fiber can absorb and hold large amounts of water. This results in a larger, bulkier stool, which is soft and easier to pass. Work gradually over several weeks up to 6 servings a day of fiber (25g a day even more if needed) in the form of: o Vegetables -- Root (potatoes, carrots, turnips), leafy green (lettuce, salad greens, celery, spinach), or cooked high residue (cabbage, broccoli, etc) o Fruit -- Fresh (unpeeled skin & pulp), Dried (prunes, apricots, cherries, etc ),  or stewed ( applesauce)  o Whole grain breads, pasta, etc (whole wheat)  o Bran cereals    Bulking Agents -- This type of water-retaining fiber generally is easily obtained each day by one of the following:  o Psyllium bran -- The psyllium plant is remarkable because its ground seeds can retain so much water. This product is available as Metamucil, Konsyl, Effersyllium, Per Diem Fiber, or the less expensive generic preparation in drug and health food stores. Although labeled a laxative, it really is not a laxative.  o Methylcellulose -- This is another fiber derived  from wood which also retains water. It is available as Citrucel. o Polyethylene Glycol - and "artificial" fiber commonly called Miralax or Glycolax.  It is helpful for people with gassy or bloated feelings with regular fiber o Flax Seed - a less gassy fiber than psyllium   No reading or other relaxing activity while on the toilet. If bowel movements take longer than 5 minutes, you are too constipated   AVOID CONSTIPATION.  High fiber and water intake usually takes care of this.  Sometimes a laxative is needed to stimulate more frequent bowel movements, but    Laxatives are not a good long-term solution as it can wear the colon out. o Osmotics (Milk of Magnesia, Fleets phosphosoda, Magnesium citrate, MiraLax, GoLytely) are safer than  o Stimulants (Senokot, Castor Oil, Dulcolax, Ex Lax)    o Do not take laxatives for more than 7days in a row.    IF SEVERELY CONSTIPATED, try a Bowel Retraining Program: o Do not use laxatives.  o Eat  a diet high in roughage, such as bran cereals and leafy vegetables.  o Drink six (6) ounces of prune or apricot juice each morning.  o Eat two (2) large servings of stewed fruit each day.  o Take one (1) heaping tablespoon of a psyllium-based bulking agent twice a day. Use sugar-free sweetener when possible to avoid excessive calories.  o Eat a normal breakfast.  o Set aside 15 minutes after breakfast to sit on the toilet, but do not strain to have a bowel movement.  o If you do not have a bowel movement by the third day, use an enema and repeat the above steps.    Controlling diarrhea o Switch to liquids and simpler foods for a few days to avoid stressing your intestines further. o Avoid dairy products (especially milk & ice cream) for a short time.  The intestines often can lose the ability to digest lactose when stressed. o Avoid foods that cause gassiness or bloating.  Typical foods include beans and other legumes, cabbage, broccoli, and dairy foods.  Every person  has some sensitivity to other foods, so listen to our body and avoid those foods that trigger problems for you. o Adding fiber (Citrucel, Metamucil, psyllium, Miralax) gradually can help thicken stools by absorbing excess fluid and retrain the intestines to act more normally.  Slowly increase the dose over a few weeks.  Too much fiber too soon can backfire and cause cramping & bloating. o Probiotics (such as active yogurt, Align, etc) may help repopulate the intestines and colon with normal bacteria and calm down a sensitive digestive tract.  Most studies show it to be of mild help, though, and such products can be costly. o Medicines:   Bismuth subsalicylate (ex. Kayopectate, Pepto Bismol) every 30 minutes for up to 6 doses can help control diarrhea.  Avoid if pregnant.   Loperamide (Immodium) can slow down diarrhea.  Start with two tablets (4mg  total) first and then try one tablet every 6 hours.  Avoid if you are having fevers or severe pain.  If you are not better or start feeling worse, stop all medicines and call your doctor for advice o Call your doctor if you are getting worse or not better.  Sometimes further testing (cultures, endoscopy, X-ray studies, bloodwork, etc) may be needed to help diagnose and treat the cause of the diarrhea. o

## 2012-03-06 ENCOUNTER — Other Ambulatory Visit: Payer: Medicaid Other

## 2012-03-14 ENCOUNTER — Emergency Department (HOSPITAL_COMMUNITY)
Admission: EM | Admit: 2012-03-14 | Discharge: 2012-03-14 | Disposition: A | Discharge status: Home / Self Care | Payer: Medicaid Other | Attending: Emergency Medicine | Admitting: Emergency Medicine

## 2012-03-14 ENCOUNTER — Encounter (HOSPITAL_COMMUNITY): Payer: Self-pay | Admitting: Emergency Medicine

## 2012-03-14 DIAGNOSIS — H43399 Other vitreous opacities, unspecified eye: Secondary | ICD-10-CM

## 2012-03-14 DIAGNOSIS — Z21 Asymptomatic human immunodeficiency virus [HIV] infection status: Secondary | ICD-10-CM | POA: Insufficient documentation

## 2012-03-14 DIAGNOSIS — Z79899 Other long term (current) drug therapy: Secondary | ICD-10-CM | POA: Insufficient documentation

## 2012-03-14 NOTE — ED Provider Notes (Signed)
History  This chart was scribed for [employer]. Rubin Payor, MD by Erskine Emery. This patient was seen in room TR02C/TR02C and the patient's care was started at 14:14.    CSN: [id number]  Arrival date & time 03/14/12  1212   None     Chief Complaint  Patient presents with  . Visual Field Change    (Consider location/radiation/quality/duration/timing/severity/associated sxs/prior treatment) HPI  Nathan Ewing is a 61 y.o. male who presents to the Emergency Department complaining of blurred vision and seeing tiny black dots in the right eye since this morning.  Pt denies any associated headaches, chest pain, trouble breathing, or h/o similar episodes. Pt does have a h/o HIV. Pt does not have an ophthalmologist.   Past Medical History  Diagnosis Date  . HIV infection   . HIV (human immunodeficiency virus infection)   . Condyloma acuminatum - perianal 01/24/2012    Past Surgical History  Procedure Date  . Knee surgery     Family History  Problem Relation Age of Onset  . Heart disease Mother   . Heart disease Father     History  Substance Use Topics  . Smoking status: Never Smoker   . Smokeless tobacco: Never Used  . Alcohol Use: No      Review of Systems  Constitutional: Negative for fever and chills.  Eyes: Positive for visual disturbance. Negative for pain and discharge.  Respiratory: Negative for shortness of breath.   Cardiovascular: Negative for chest pain.  Gastrointestinal: Negative for nausea and vomiting.  Neurological: Negative for weakness and headaches.    Allergies  Quetiapine and Sulfamethoxazole w-trimethoprim  Home Medications   Current Outpatient Rx  Name Route Sig Dispense Refill  . ARIPIPRAZOLE 20 MG PO TABS Oral Take 20 mg by mouth at bedtime.    Marland Kitchen DAPSONE 100 MG PO TABS Oral Take 100 mg by mouth at bedtime.    Marland Kitchen EMTRICITABINE-TENOFOVIR 200-300 MG PO TABS Oral Take 1 tablet by mouth at bedtime.    Marland Kitchen ESCITALOPRAM OXALATE 20 MG PO TABS Oral  Take 20 mg by mouth at bedtime.    Marland Kitchen RALTEGRAVIR POTASSIUM 400 MG PO TABS Oral Take 800 mg by mouth at bedtime.      BP 117/97  Pulse 105  Temp 98.5 F (36.9 C) (Oral)  Resp 18  SpO2 97%  Physical Exam  Nursing note and vitals reviewed. Constitutional: He is oriented to person, place, and time. He appears well-developed and well-nourished. No distress.  HENT:  Head: Normocephalic and atraumatic.  Eyes: EOM are normal. Pupils are equal, round, and reactive to light. Right conjunctiva is not injected.       Visual fields intact. No erythema or scleral injection.   Neck: Neck supple. No tracheal deviation present.  Cardiovascular: Normal rate.   Pulmonary/Chest: Effort normal. No respiratory distress.  Musculoskeletal: Normal range of motion.  Neurological: He is alert and oriented to person, place, and time.  Skin: Skin is warm and dry.  Psychiatric: He has a normal mood and affect. His behavior is normal.    ED Course  Procedures (including critical care time) DIAGNOSTIC STUDIES: Oxygen Saturation is 97% on room air, adequate by my interpretation.    COORDINATION OF CARE: 14:14--I evaluated the patient and we discussed a treatment plan including further eye examination to which the pt agreed.   14:40--I rechecked the pt and instructed him to follow up with Dr. Allyne Gee on Monday.    Labs Reviewed - No data to  display No results found.   1. Visual floaters       MDM   I personally performed the services described in this documentation, which was scribed in my presence. The recorded information has been reviewed and considered.  Patient is complaining of waking with thousand the tiny little dots in his right vision. He also states he is small area that looked like a hair that would move with position. Vision does not appear to be grossly infected. It began when he woke with it this morning. It is possibly a vitreous detachment. I discussed with Dr. Allyne Gee from  ophthalmology. He will see the patient at 950 on Monday. Any worsening of symptoms the patient is to call Dr. Allyne Gee.         Juliet Rude. Rubin Payor, MD 03/14/12 1443

## 2012-03-14 NOTE — ED Notes (Signed)
Pt sts seeing multiple small black dots in right eye upon waking this am; pt denies other complaint

## 2012-03-18 ENCOUNTER — Ambulatory Visit: Payer: Medicaid Other | Admitting: Internal Medicine

## 2012-03-18 ENCOUNTER — Telehealth: Payer: Self-pay | Admitting: *Deleted

## 2012-03-18 NOTE — Telephone Encounter (Signed)
Patient no showed lab and f/u appt today.  Will refer to [facility] as he does not have a working phone number. Wendall Mola

## 2012-03-20 ENCOUNTER — Ambulatory Visit: Payer: Medicaid Other | Admitting: Internal Medicine

## 2012-03-20 ENCOUNTER — Telehealth: Payer: Self-pay | Admitting: *Deleted

## 2012-03-20 NOTE — Telephone Encounter (Signed)
Patient no showed appt today, active with [facility], notified Revonda Standard he no showed. Wendall Mola CMA

## 2012-03-24 ENCOUNTER — Other Ambulatory Visit (INDEPENDENT_AMBULATORY_CARE_PROVIDER_SITE_OTHER): Payer: Medicaid Other

## 2012-03-24 DIAGNOSIS — Z79899 Other long term (current) drug therapy: Secondary | ICD-10-CM

## 2012-03-24 DIAGNOSIS — B20 Human immunodeficiency virus [HIV] disease: Secondary | ICD-10-CM

## 2012-03-24 DIAGNOSIS — Z113 Encounter for screening for infections with a predominantly sexual mode of transmission: Secondary | ICD-10-CM

## 2012-03-24 LAB — CBC
HCT: 44.5 % (ref 39.0–52.0)
Hemoglobin: 14.6 g/dL (ref 13.0–17.0)
MCH: 31.2 pg (ref 26.0–34.0)
MCHC: 32.8 g/dL (ref 30.0–36.0)

## 2012-03-25 LAB — COMPREHENSIVE METABOLIC PANEL
Alkaline Phosphatase: 64 U/L (ref 39–117)
BUN: 7 mg/dL (ref 6–23)
Glucose, Bld: 84 mg/dL (ref 70–99)
Sodium: 141 mEq/L (ref 135–145)
Total Bilirubin: 0.5 mg/dL (ref 0.3–1.2)

## 2012-03-25 LAB — LIPID PANEL
HDL: 40 mg/dL (ref >39)
LDL Cholesterol: 90 mg/dL (ref 0–99)
Triglycerides: 62 mg/dL (ref <150)
VLDL: 12 mg/dL (ref 0–40)

## 2012-03-25 LAB — HIV-1 RNA QUANT-NO REFLEX-BLD: HIV 1 RNA Quant: 80 copies/mL — ABNORMAL HIGH (ref <20)

## 2012-04-15 ENCOUNTER — Encounter: Payer: Self-pay | Admitting: Internal Medicine

## 2012-04-15 ENCOUNTER — Ambulatory Visit (INDEPENDENT_AMBULATORY_CARE_PROVIDER_SITE_OTHER): Payer: Medicaid Other | Admitting: Internal Medicine

## 2012-04-15 VITALS — BP 125/82 | HR 58 | Temp 97.6°F | Ht 70.5 in | Wt 204.0 lb

## 2012-04-15 DIAGNOSIS — Z23 Encounter for immunization: Secondary | ICD-10-CM

## 2012-04-15 DIAGNOSIS — B1710 Acute hepatitis C without hepatic coma: Secondary | ICD-10-CM

## 2012-04-15 DIAGNOSIS — B20 Human immunodeficiency virus [HIV] disease: Secondary | ICD-10-CM

## 2012-04-15 NOTE — Progress Notes (Signed)
  Subjective:    Patient ID: Nathan Ewing, male    DOB: 06/08/1951, 61 y.o.   MRN: [id number]  HPI He comes in here for followup of his HIV. He continues on Isentress and Truvada and denies any missed doses. He tells me he has no problems with his twice a day her Isentress medicine. His CD4 nadir is 50, in 2012 though he was also seen at [facility] previously and no records currently available. His only been on therapy the last several years. He was diagnosed in 19.  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 Previous viral load was 54,000. He also continues to take dapsone. He denies alcohol use   Review of Systems  Constitutional: Negative for fever, fatigue and unexpected weight change.  HENT: Negative for sore throat and trouble swallowing.   Respiratory: Negative for cough, shortness of breath and wheezing.   Cardiovascular: Negative for chest pain, palpitations and leg swelling.  Gastrointestinal: Negative for nausea, abdominal pain and diarrhea.  Musculoskeletal: Negative for myalgias, joint swelling and arthralgias.  Neurological: Negative for dizziness and headaches.  Hematological: Negative for adenopathy.       Objective:   Physical Exam  Constitutional: He appears well-developed and well-nourished. No distress.  HENT:  Mouth/Throat: Oropharynx is clear and moist. No oropharyngeal exudate.  Cardiovascular: Normal rate, regular rhythm and normal heart sounds.  Exam reveals no gallop and no friction rub.   No murmur heard. Pulmonary/Chest: Effort normal and breath sounds normal. No respiratory distress. He has no wheezes. He has no rales.  Abdominal: Soft. Bowel sounds are normal. He exhibits no distension. There is no tenderness. There is no rebound.  Lymphadenopathy:    He has no cervical adenopathy.          Assessment & Plan:

## 2012-04-15 NOTE — Assessment & Plan Note (Signed)
He was counseled on avoiding alcohol. He is not a candidate for interferon based therapy at this time due to his underlying psychiatric illness and age. He may be a candidate for oral therapy in the near future.

## 2012-04-15 NOTE — Patient Instructions (Addendum)
Keep taking your medicine daily

## 2012-04-15 NOTE — Assessment & Plan Note (Signed)
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 I did discuss with him compliance and he denies any missed doses. He does have decent suppression of his viral load. I will continue with his current medications though may consider changing to a protease inhibitor this therapy if he does not have improved CD4 recovery. He will return in 4 months. Next  His given information to establish with a primary care physician

## 2012-06-19 ENCOUNTER — Telehealth: Payer: Self-pay | Admitting: Licensed Clinical Social Worker

## 2012-06-19 NOTE — Telephone Encounter (Signed)
Patient called to see if he could get a personal care nurse. He states he had retina surgery and he can't see right now. He states he is very fatigued, has low energy and can't cook for himself without falling asleep while the stove is on. He had an agency come to him from [employer] and I told him that they will need to fax [place] a request.

## 2012-06-30 IMAGING — CR DG KNEE COMPLETE 4+V*L*
4 series · 4 of 4 positions shown · non-contrast
Comparison: 04/25/2010

CLINICAL DATA: Left knee pain and swelling.  History of previous
knee surgery in [place].

LEFT KNEE - COMPLETE 4+ VIEW

[t knee ap left]
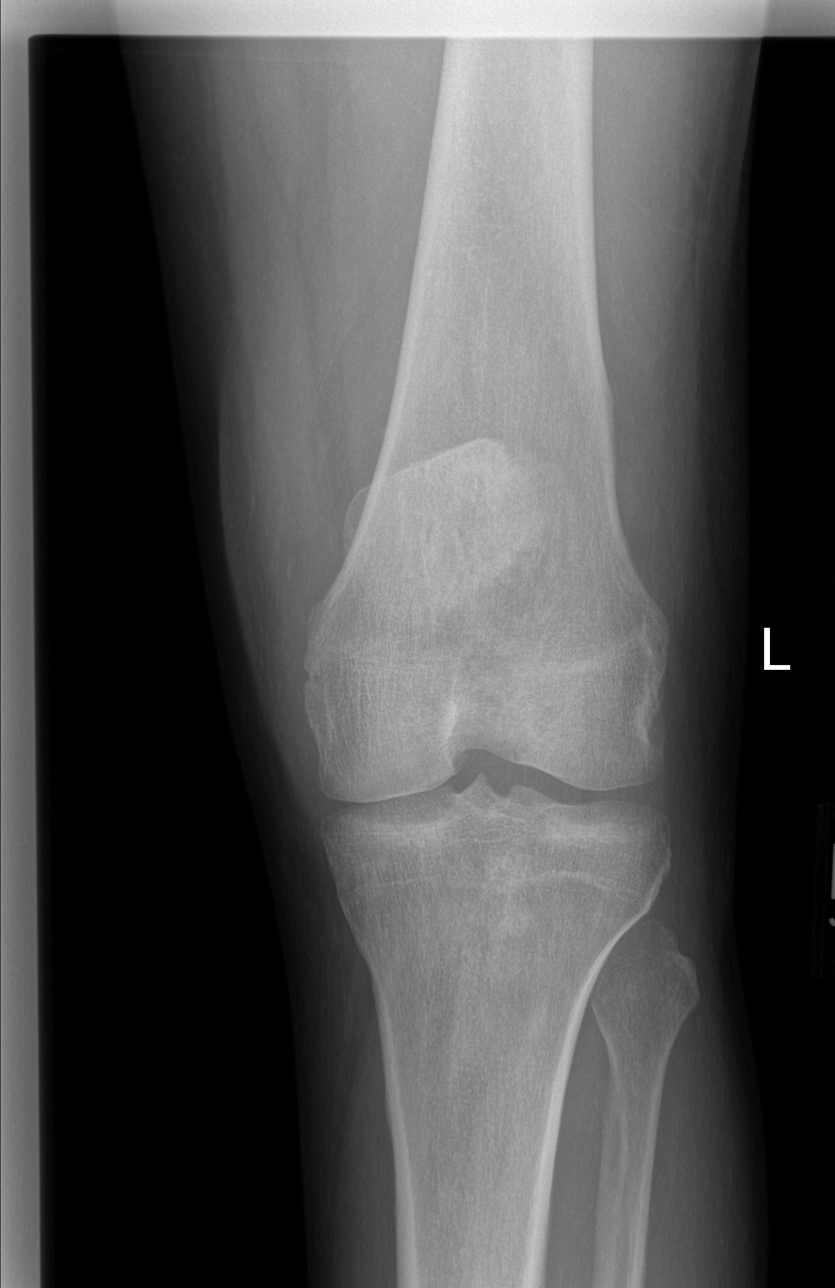

[t knee oblique left (1 of 2)]
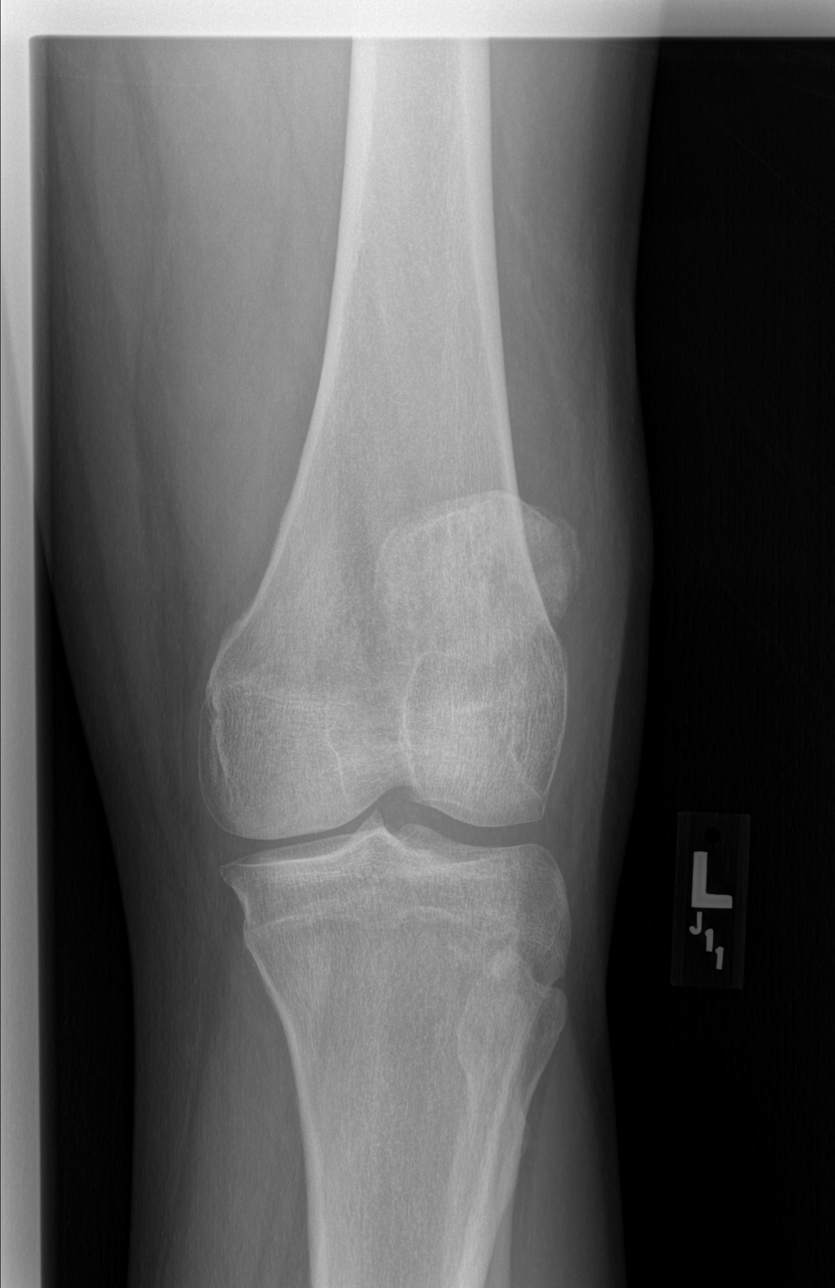

[t knee oblique left (2 of 2)]
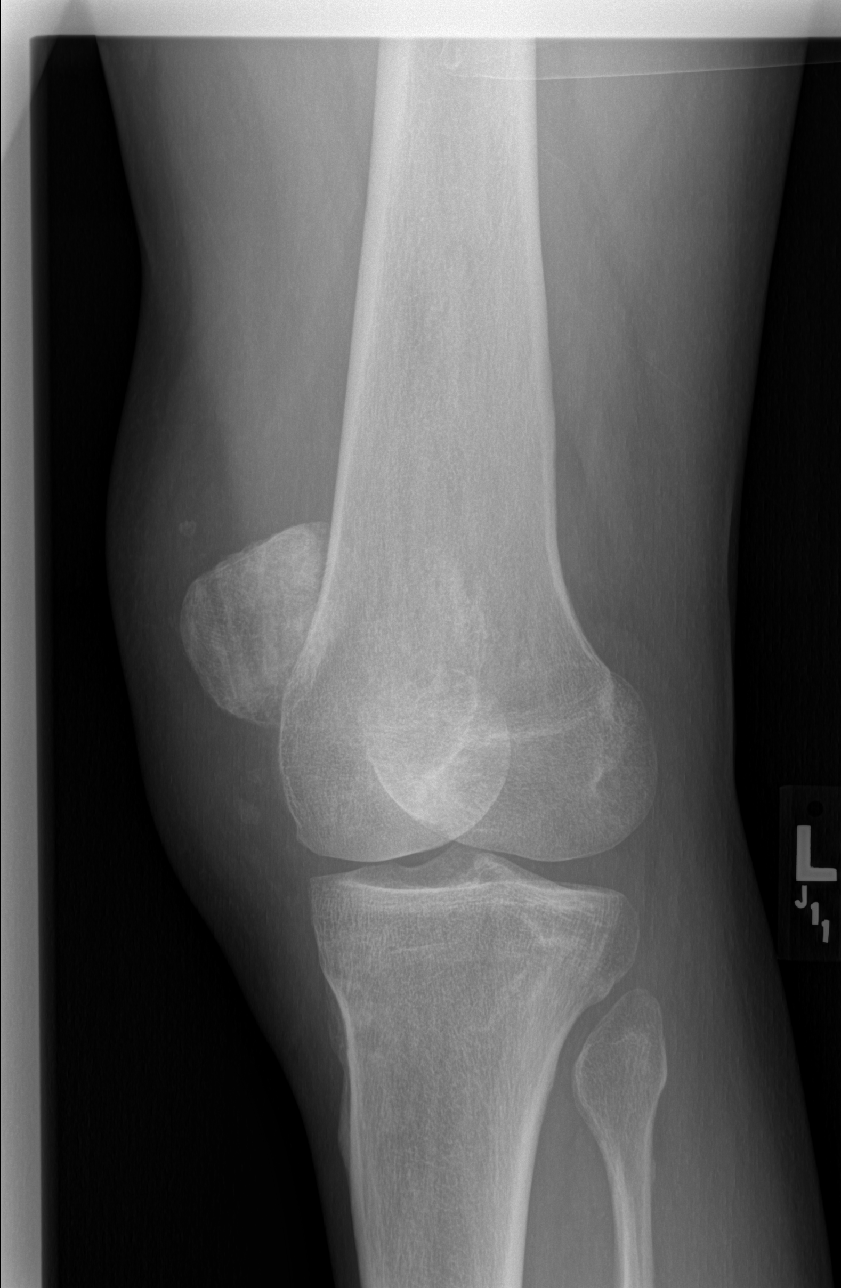

[t knee lat left]
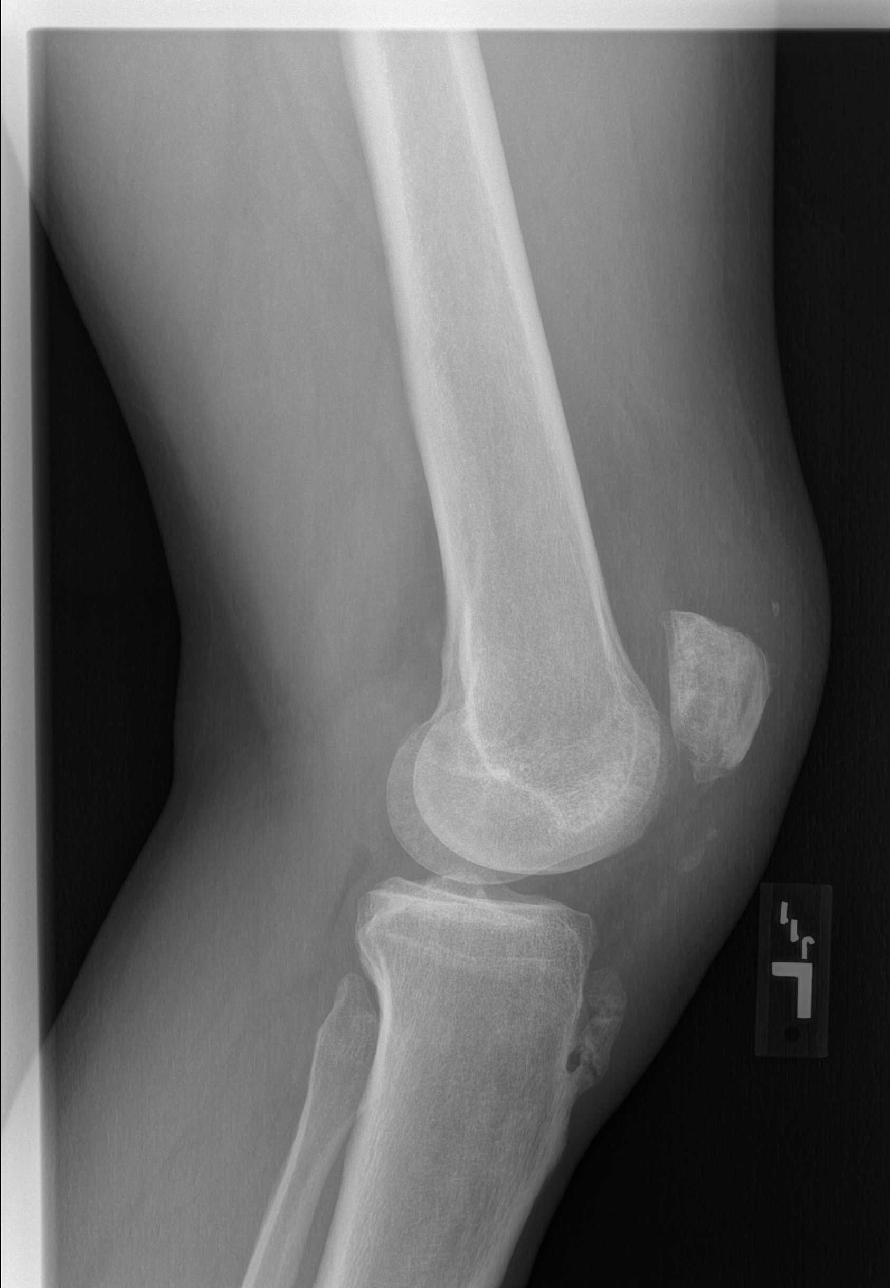

[4 of 4 positions shown; findings below may reference images not displayed]

FINDINGS: Mild degenerative changes in the medial and
patellofemoral compartment are noted.
A very small knee effusion is present.
Mild anterior soft tissue swelling is noted which could represent
bursitis.
There is no evidence of acute fracture, subluxation or dislocation.
No significant changes since the prior study are noted.
IMPRESSION: Stable left knee with small effusion and mild anterior soft tissue
swelling which could represent bursitis.

Mild degenerative changes.

No acute bony abnormality.

## 2012-07-01 ENCOUNTER — Emergency Department (HOSPITAL_COMMUNITY)
Admission: EM | Admit: 2012-07-01 | Discharge: 2012-07-01 | Disposition: A | Discharge status: Home / Self Care | Payer: Medicaid Other | Attending: Emergency Medicine | Admitting: Emergency Medicine

## 2012-07-01 ENCOUNTER — Emergency Department (HOSPITAL_COMMUNITY): Payer: Medicaid Other

## 2012-07-01 ENCOUNTER — Encounter (HOSPITAL_COMMUNITY): Payer: Self-pay | Admitting: Emergency Medicine

## 2012-07-01 DIAGNOSIS — M25569 Pain in unspecified knee: Secondary | ICD-10-CM | POA: Insufficient documentation

## 2012-07-01 DIAGNOSIS — B20 Human immunodeficiency virus [HIV] disease: Secondary | ICD-10-CM | POA: Insufficient documentation

## 2012-07-01 DIAGNOSIS — Z79899 Other long term (current) drug therapy: Secondary | ICD-10-CM | POA: Insufficient documentation

## 2012-07-01 MED ORDER — HYDROCODONE-ACETAMINOPHEN 5-325 MG PO TABS: 1.0000 | ORAL_TABLET | Freq: Four times a day (QID) | ORAL | Status: DC | PRN

## 2012-07-01 MED ORDER — PREDNISONE 50 MG PO TABS: 50.0000 mg | ORAL_TABLET | Freq: Every day | ORAL | Status: DC

## 2012-07-01 NOTE — ED Notes (Signed)
Pt c/o lt knee pain x 1 wk.  Denies injury.  Pain 10/10

## 2012-07-01 NOTE — ED Provider Notes (Signed)
Medical screening examination/treatment/procedure(s) were performed by non-physician practitioner and as supervising physician I was immediately available for consultation/collaboration.   Loren Racer, MD 07/01/12 2216

## 2012-07-01 NOTE — ED Provider Notes (Signed)
History     CSN: [id number]  Arrival date & time 07/01/12  1452   First MD Initiated Contact with Patient 07/01/12 1605      Chief Complaint  Patient presents with  . Knee Pain    (Consider location/radiation/quality/duration/timing/severity/associated sxs/prior treatment) HPI Patient presents to the emergency department with left knee pain for the last 2 days.  Patient, states that he had previous knee surgery, ruptured tendon in his knee.  Patient, states that movement and palpation make his pain, worse.  Patient denies taking anything prior to arrival, for his discomfort.  Patient denies nausea, vomiting, fever, joint swelling, headache, numbness, or weakness    Past Medical History  Diagnosis Date  . HIV infection   . HIV (human immunodeficiency virus infection)   . Condyloma acuminatum - perianal 01/24/2012    Past Surgical History  Procedure Date  . Knee surgery     Family History  Problem Relation Age of Onset  . Heart disease Mother   . Heart disease Father     History  Substance Use Topics  . Smoking status: Never Smoker   . Smokeless tobacco: Never Used  . Alcohol Use: No      Review of Systems All other systems negative except as documented in the HPI. All pertinent positives and negatives as reviewed in the HPI.  Allergies  Quetiapine and Sulfamethoxazole w-trimethoprim  Home Medications   Current Outpatient Rx  Name  Route  Sig  Dispense  Refill  . ARIPIPRAZOLE 20 MG PO TABS   Oral   Take 20 mg by mouth at bedtime.         Marland Kitchen DAPSONE 100 MG PO TABS   Oral   Take 100 mg by mouth at bedtime.         Marland Kitchen EMTRICITABINE-TENOFOVIR 200-300 MG PO TABS   Oral   Take 1 tablet by mouth at bedtime.         Marland Kitchen ESCITALOPRAM OXALATE 20 MG PO TABS   Oral   Take 20 mg by mouth at bedtime.         Marland Kitchen RALTEGRAVIR POTASSIUM 400 MG PO TABS   Oral   Take 800 mg by mouth at bedtime.           BP 120/77  Pulse 86  Temp 97.9 F (36.6 C)  (Oral)  Resp 12  SpO2 96%  Physical Exam  Constitutional: He is oriented to person, place, and time. He appears well-developed and well-nourished. No distress.  HENT:  Head: Normocephalic and atraumatic.  Musculoskeletal:       Left knee: He exhibits normal range of motion, no swelling, no effusion, no ecchymosis, no deformity and no bony tenderness. tenderness found. Medial joint line and lateral joint line tenderness noted.  Neurological: He is alert and oriented to person, place, and time. He has normal reflexes. Coordination normal.  Skin: Skin is warm and dry. No rash noted. No erythema.    ED Course  Procedures (including critical care time) Patient will be referred back to orthopedics for further evaluation.  Told to return here for any worsening in his condition.  Ice and elevate his knee.  Patient will be placed in a knee immobilizer for further care and comfort.    MDM         Carlyle Dolly, PA-C 07/01/12 1727

## 2012-07-03 ENCOUNTER — Telehealth: Payer: Self-pay | Admitting: *Deleted

## 2012-07-03 ENCOUNTER — Ambulatory Visit: Payer: Medicaid Other | Admitting: Internal Medicine

## 2012-07-03 NOTE — Telephone Encounter (Signed)
Called patient and left voice mail to call the clinic to reschedule appt, he no showed today. Nathan Ewing

## 2012-07-14 ENCOUNTER — Telehealth: Payer: Self-pay | Admitting: *Deleted

## 2012-07-14 NOTE — Telephone Encounter (Signed)
Patient called and advised he is still having trouble with his knee swelling and since his laser eye surgery he is having trouble seeing. He says he needs an appt to talk with Dr Luciana Axe about his knee, eyes and the need for a home health worker until he gets back on his feet. He ask to have an appt to discuss so I gave him 07/17/12 at 945 with Dr Luciana Axe.

## 2012-07-17 ENCOUNTER — Encounter: Payer: Self-pay | Admitting: Internal Medicine

## 2012-07-17 ENCOUNTER — Ambulatory Visit (INDEPENDENT_AMBULATORY_CARE_PROVIDER_SITE_OTHER): Payer: Medicaid Other | Admitting: Internal Medicine

## 2012-07-17 VITALS — BP 131/83 | HR 81 | Temp 98.1°F | Ht 70.5 in | Wt 200.0 lb

## 2012-07-17 DIAGNOSIS — M25569 Pain in unspecified knee: Secondary | ICD-10-CM

## 2012-07-17 DIAGNOSIS — M25562 Pain in left knee: Secondary | ICD-10-CM

## 2012-07-17 DIAGNOSIS — B1920 Unspecified viral hepatitis C without hepatic coma: Secondary | ICD-10-CM

## 2012-07-17 NOTE — Assessment & Plan Note (Addendum)
He will be referred to a local orthopedist. I also will have him talk to someone about transportation. They will return for scheduled visit in January with labs 2 weeks before.  I do not see any indication for a home health aide. He will need to be evaluated by orthopedics and if he has any other issues he will need further evaluation but I my assessment there is no indication for any home assistance.

## 2012-07-17 NOTE — Progress Notes (Signed)
  Subjective:    Patient ID: Nathan Ewing, male    DOB: 1950-09-28, 61 y.o.   MRN: [id number]  HPI He comes in here for a work in visit. He has had left knee pain that stems from a previous surgery several years ago. The surgeries done at [facility]. He states that it was done because of swelling and pain. He is now having swelling and pain now. He currently has a brace on it. He now lives in [place] and would like to be referred to a local orthopedist. He also is having difficulty with transportation to his appointments. He is having problems with depression and is scheduled for mental health and has trouble getting to other appointments. He also has requested a home health aide.   Review of Systems     Objective:   Physical Exam  Constitutional: He appears well-developed and well-nourished. No distress.  Musculoskeletal:       Left knee brace          Assessment & Plan:

## 2012-07-21 ENCOUNTER — Telehealth: Payer: Self-pay | Admitting: *Deleted

## 2012-07-21 NOTE — Telephone Encounter (Signed)
This office is unable to refer patient to an orthopedic specialist because he has Medicaid [place] and he has listed on his card as his PCP [facility] outpatient. Unable to notify patient of this as his phone is not in service.  I have notified his case worker Artis Delay who will be seeing him 07/31/12.  The referral needs to come from his PCP and if he no longer goes there he needs to have Social Services assign a new PCP. Wendall Mola CMA

## 2012-07-22 ENCOUNTER — Telehealth: Payer: Self-pay | Admitting: *Deleted

## 2012-07-22 NOTE — Telephone Encounter (Signed)
Request from [facility] for [facility] services, per Dr. Luciana Axe patient is not a candidate for services.  Robin at [employer] notified. Wendall Mola CMA

## 2012-07-28 ENCOUNTER — Telehealth: Payer: Self-pay | Admitting: *Deleted

## 2012-07-28 NOTE — Telephone Encounter (Signed)
Patient called and wanted to know why we denied his request for a home health aide. Advised him that his doctor felt he did not need a home health aide and he will need to talk to his provider to find out why. He asked when his next visit was and advised him 08/20/11 and to make sure he spoke to him at that time as I nor any of the other assistants can change the decision of the provider. He was unhappy but he understood and advised he will talk to his doctor at his next visit because he knows people who have aide and have his same issues.

## 2012-07-31 ENCOUNTER — Other Ambulatory Visit: Payer: Medicaid Other

## 2012-07-31 DIAGNOSIS — B20 Human immunodeficiency virus [HIV] disease: Secondary | ICD-10-CM

## 2012-07-31 DIAGNOSIS — B1920 Unspecified viral hepatitis C without hepatic coma: Secondary | ICD-10-CM

## 2012-07-31 LAB — CBC WITH DIFFERENTIAL/PLATELET
Basophils Relative: 0 % (ref 0–1)
Eosinophils Absolute: 0 10*3/uL (ref 0.0–0.7)
Eosinophils Relative: 1 % (ref 0–5)
MCH: 31.9 pg (ref 26.0–34.0)
MCHC: 33.6 g/dL (ref 30.0–36.0)
MCV: 94.7 fL (ref 78.0–100.0)
Monocytes Relative: 6 % (ref 3–12)
Neutrophils Relative %: 74 % (ref 43–77)
Platelets: 142 10*3/uL — ABNORMAL LOW (ref 150–400)

## 2012-07-31 LAB — COMPLETE METABOLIC PANEL WITH GFR
Alkaline Phosphatase: 63 U/L (ref 39–117)
CO2: 29 mEq/L (ref 19–32)
Creat: 1.14 mg/dL (ref 0.50–1.35)
GFR, Est African American: 80 mL/min
GFR, Est Non African American: 70 mL/min
Glucose, Bld: 78 mg/dL (ref 70–99)
Sodium: 139 mEq/L (ref 135–145)
Total Bilirubin: 0.6 mg/dL (ref 0.3–1.2)
Total Protein: 6.7 g/dL (ref 6.0–8.3)

## 2012-08-01 LAB — HIV-1 RNA QUANT-NO REFLEX-BLD: HIV 1 RNA Quant: 453 copies/mL — ABNORMAL HIGH (ref <20)

## 2012-08-01 LAB — T-HELPER CELL (CD4) - (RCID CLINIC ONLY)
CD4 % Helper T Cell: 15 % — ABNORMAL LOW (ref 33–55)
CD4 T Cell Abs: 190 uL — ABNORMAL LOW (ref 400–2700)

## 2012-08-19 ENCOUNTER — Ambulatory Visit: Payer: Medicaid Other | Admitting: Internal Medicine

## 2012-08-19 ENCOUNTER — Telehealth: Payer: Self-pay | Admitting: *Deleted

## 2012-08-19 NOTE — Telephone Encounter (Signed)
Called patient to reschedule his appt, he no showed today and phone is not in service. Wendall Mola CMA

## 2012-11-11 ENCOUNTER — Telehealth: Payer: Self-pay | Admitting: *Deleted

## 2012-11-11 ENCOUNTER — Ambulatory Visit: Payer: Medicaid Other | Admitting: Internal Medicine

## 2012-11-11 NOTE — Telephone Encounter (Signed)
Tried to call patient regarding missing his appt today, this is his 3rd no show. No voice mail, if he calls clinic he needs to be come to walk in clinic. Nathan Ewing

## 2012-11-29 ENCOUNTER — Encounter (HOSPITAL_COMMUNITY): Payer: Self-pay | Admitting: Nurse Practitioner

## 2012-11-29 ENCOUNTER — Emergency Department (HOSPITAL_COMMUNITY): Payer: Medicaid Other

## 2012-11-29 ENCOUNTER — Emergency Department (HOSPITAL_COMMUNITY)
Admission: EM | Admit: 2012-11-29 | Discharge: 2012-11-29 | Disposition: A | Discharge status: Home / Self Care | Payer: Medicaid Other | Attending: Emergency Medicine | Admitting: Emergency Medicine

## 2012-11-29 DIAGNOSIS — Z21 Asymptomatic human immunodeficiency virus [HIV] infection status: Secondary | ICD-10-CM | POA: Insufficient documentation

## 2012-11-29 DIAGNOSIS — M25562 Pain in left knee: Secondary | ICD-10-CM

## 2012-11-29 DIAGNOSIS — M25569 Pain in unspecified knee: Secondary | ICD-10-CM | POA: Insufficient documentation

## 2012-11-29 DIAGNOSIS — M1710 Unilateral primary osteoarthritis, unspecified knee: Secondary | ICD-10-CM | POA: Insufficient documentation

## 2012-11-29 DIAGNOSIS — IMO0001 Reserved for inherently not codable concepts without codable children: Secondary | ICD-10-CM | POA: Insufficient documentation

## 2012-11-29 DIAGNOSIS — Z79899 Other long term (current) drug therapy: Secondary | ICD-10-CM | POA: Insufficient documentation

## 2012-11-29 DIAGNOSIS — M1712 Unilateral primary osteoarthritis, left knee: Secondary | ICD-10-CM

## 2012-11-29 DIAGNOSIS — IMO0002 Reserved for concepts with insufficient information to code with codable children: Secondary | ICD-10-CM | POA: Insufficient documentation

## 2012-11-29 MED ORDER — OXYCODONE-ACETAMINOPHEN 5-325 MG PO TABS
2.0000 | ORAL_TABLET | Freq: Once | ORAL | Status: AC
  Administered 2012-11-29: 2 via ORAL
  Filled 2012-11-29: qty 2

## 2012-11-29 MED ORDER — TRAMADOL HCL 50 MG PO TABS: 50.0000 mg | ORAL_TABLET | Freq: Four times a day (QID) | ORAL | Status: DC | PRN

## 2012-11-29 NOTE — ED Provider Notes (Signed)
History     CSN: [id number]  Arrival date & time 11/29/12  1651   First MD Initiated Contact with Patient 11/29/12 1724      Chief Complaint  Patient presents with  . Leg Pain    (Consider location/radiation/quality/duration/timing/severity/associated sxs/prior treatment) HPI Comments: Patient is a 62 year old male with a history of left knee surgery who presents for knee pain x6 months that has been gradually worsening over the last week. Patient states pain is sharp and shooting in nature, radiating down to his left foot and up to his left hip. Pain is worse with movement and weight-bearing and improved with rest and nonweightbearing. Patient admits to associated swelling of his left knee; he has tried Tylenol and Aleve without relief of symptoms. Patient denies any knee trauma, redness or warmth, inability to ambulate, and numbness or tingling in his lower extremities.  The history is provided by the patient. No language interpreter was used.    Past Medical History  Diagnosis Date  . HIV infection   . HIV (human immunodeficiency virus infection)   . Condyloma acuminatum - perianal 01/24/2012    Past Surgical History  Procedure Laterality Date  . Knee surgery      Family History  Problem Relation Age of Onset  . Heart disease Mother   . Heart disease Father     History  Substance Use Topics  . Smoking status: Never Smoker   . Smokeless tobacco: Never Used  . Alcohol Use: No     Review of Systems  Constitutional: Negative for fever.  Musculoskeletal: Positive for myalgias and arthralgias.  Skin: Negative for color change, pallor, rash and wound.  Neurological: Negative for numbness.  All other systems reviewed and are negative.    Allergies  Quetiapine and Sulfamethoxazole w-trimethoprim  Home Medications   Current Outpatient Rx  Name  Route  Sig  Dispense  Refill  . acetaminophen (TYLENOL) 500 MG tablet   Oral   Take 1,000 mg by mouth every 6 (six)  hours as needed for pain.         . ARIPiprazole (ABILIFY) 20 MG tablet   Oral   Take 20 mg by mouth at bedtime.         Marland Kitchen aspirin-acetaminophen-caffeine (EXCEDRIN MIGRAINE) 250-250-65 MG per tablet   Oral   Take 1 tablet by mouth every 6 (six) hours as needed for pain.         . dapsone 100 MG tablet   Oral   Take 100 mg by mouth at bedtime.         Marland Kitchen emtricitabine-tenofovir (TRUVADA) 200-300 MG per tablet   Oral   Take 1 tablet by mouth at bedtime.         . raltegravir (ISENTRESS) 400 MG tablet   Oral   Take 800 mg by mouth at bedtime.         . traMADol (ULTRAM) 50 MG tablet   Oral   Take 1 tablet (50 mg total) by mouth every 6 (six) hours as needed for pain.   15 tablet   0     BP 131/82  Pulse 62  Temp(Src) 98 F (36.7 C) (Oral)  Resp 18  SpO2 98%  Physical Exam  Nursing note and vitals reviewed. Constitutional: He is oriented to person, place, and time. He appears well-developed and well-nourished. No distress.  HENT:  Head: Normocephalic and atraumatic.  Eyes: Conjunctivae are normal. No scleral icterus.  Neck: Normal range of motion.  Cardiovascular: Intact distal pulses.   Dorsalis pedis and posterior tibial pulses 2+ bilaterally. Normal capillary refill.  Pulmonary/Chest: Effort normal.  Musculoskeletal: He exhibits no edema.       Left hip: Normal.       Left ankle: Normal.  Soft tissue swelling appreciated inferior to the left patella without effusion. Tenderness to palpation of the lateral joint line as well as with valgus and varus stressing. Patient with limited range of motion on knee flexion secondary to discomfort. No discomfort with knee extension and no MCL or LCL laxity. No tenderness on palpation of the left calf muscle.  Neurological: He is alert and oriented to person, place, and time.  DTRs normal and symmetric; gross sensation intact in bilateral lower extremities  Skin: Skin is warm and dry. No rash noted. He is not  diaphoretic. No erythema. No pallor.  Psychiatric: He has a normal mood and affect. His behavior is normal.    ED Course  Procedures (including critical care time)  Labs Reviewed - No data to display Dg Knee Complete 4 Views Left  11/29/2012  *RADIOLOGY REPORT*  Clinical Data: Left knee pain, history of left knee surgery  LEFT KNEE - COMPLETE 4+ VIEW  Comparison: 07/01/2012  Findings: No fracture or dislocation is seen.  Mild degenerative changes, most prominent in the patellofemoral compartment.  Prominent ossification near the tibial tubercle with additional calcifications in the anterior soft tissues, unchanged.  No suprapatellar knee joint effusion.  IMPRESSION: No fracture or dislocation is seen.  Mild degenerative changes, unchanged.   Original Report Authenticated By: Charline Bills, M.D.      1. Knee pain, left   2. Degenerative joint disease of knee, left      MDM  Patient presents for left knee pain x6 months that has been gradually worsening over the past week; pain is sharp and shooting in nature, radiating down to his left foot and up to his left hip. On physical exam, patient neurovascularly intact with soft tissue swelling inferior to L patella without effusion. TTP of lateral joint line as well as with valgus and varus stressing. No erythema or warmth of joint to suspect infectious etiology. Xray of knee to be obtained to r/o fracture or dislocation.  X-ray without evidence of fracture or dislocation. Mild degenerative changes of the left knee appreciated. Patient well and nontoxic appearing, hemodynamically stable in no acute distress. Stable for discharge with orthopedic and primary care followup. Patient given rice instructions and tramadol for symptomatic relief. Indications for ED return discussed. Patient states comfort and understanding with the discharge plan with no unaddressed concerns. Patient workup and management discussed with Dr. Hyacinth Meeker who is in  agreement.     Antony Madura, PA-C 11/29/12 1948

## 2012-11-29 NOTE — ED Notes (Signed)
C/o L leg pain from L hip to L knee, "feels like my leg locks up when i stand up." reports he had surgery on this leg 2 years ago but no new injuries. Ambulatory, cms intact

## 2012-11-29 NOTE — ED Notes (Signed)
Pt dc'd w/all belongings, alert and ambulatory upon dc, bus pass given for transportation

## 2012-11-30 NOTE — ED Provider Notes (Signed)
  Medical screening examination/treatment/procedure(s) were performed by non-physician practitioner and as supervising physician I was immediately available for consultation/collaboration.    Vida Roller, MD 11/30/12 1019

## 2012-12-08 ENCOUNTER — Ambulatory Visit: Payer: Medicaid Other | Admitting: Internal Medicine

## 2013-01-01 ENCOUNTER — Encounter: Payer: Self-pay | Admitting: *Deleted

## 2013-01-01 ENCOUNTER — Other Ambulatory Visit: Payer: Self-pay | Admitting: Internal Medicine

## 2013-01-01 DIAGNOSIS — B20 Human immunodeficiency virus [HIV] disease: Secondary | ICD-10-CM

## 2013-01-01 NOTE — Telephone Encounter (Signed)
Opened in error

## 2013-02-05 ENCOUNTER — Telehealth: Payer: Self-pay | Admitting: *Deleted

## 2013-02-05 ENCOUNTER — Other Ambulatory Visit: Payer: Self-pay | Admitting: Internal Medicine

## 2013-02-05 NOTE — Telephone Encounter (Signed)
Patient requested refills of his medications, has missed his follow up appointments.  RN called the pharmacy, they stated he picks up his medications irregularly.  Pt's phone is not in service.  RN called patient's emergency contact, his daughter, and asked for her to please have him contact our office. He must be seen as a walk in due to his record of no-shows.

## 2013-02-24 ENCOUNTER — Other Ambulatory Visit: Payer: Medicaid Other

## 2013-03-09 ENCOUNTER — Ambulatory Visit: Payer: Medicaid Other | Admitting: Internal Medicine

## 2013-03-11 ENCOUNTER — Encounter: Payer: Self-pay | Admitting: Internal Medicine

## 2013-03-11 ENCOUNTER — Ambulatory Visit (INDEPENDENT_AMBULATORY_CARE_PROVIDER_SITE_OTHER): Payer: Medicaid Other | Admitting: Internal Medicine

## 2013-03-11 VITALS — BP 128/78 | HR 74 | Temp 98.4°F | Ht 70.5 in | Wt 199.0 lb

## 2013-03-11 DIAGNOSIS — R21 Rash and other nonspecific skin eruption: Secondary | ICD-10-CM

## 2013-03-11 DIAGNOSIS — B1710 Acute hepatitis C without hepatic coma: Secondary | ICD-10-CM

## 2013-03-11 DIAGNOSIS — B20 Human immunodeficiency virus [HIV] disease: Secondary | ICD-10-CM

## 2013-03-11 LAB — CBC WITH DIFFERENTIAL/PLATELET
Basophils Absolute: 0 10*3/uL (ref 0.0–0.1)
Eosinophils Relative: 1 % (ref 0–5)
HCT: 39.2 % (ref 39.0–52.0)
Hemoglobin: 12.9 g/dL — ABNORMAL LOW (ref 13.0–17.0)
Lymphocytes Relative: 17 % (ref 12–46)
Lymphs Abs: 1.1 10*3/uL (ref 0.7–4.0)
MCV: 95.4 fL (ref 78.0–100.0)
Monocytes Absolute: 0.4 10*3/uL (ref 0.1–1.0)
Monocytes Relative: 6 % (ref 3–12)
Neutro Abs: 4.9 10*3/uL (ref 1.7–7.7)
RBC: 4.11 MIL/uL — ABNORMAL LOW (ref 4.22–5.81)
RDW: 15.2 % (ref 11.5–15.5)
WBC: 6.4 10*3/uL (ref 4.0–10.5)

## 2013-03-11 LAB — COMPLETE METABOLIC PANEL WITH GFR
AST: 20 U/L (ref 0–37)
Alkaline Phosphatase: 65 U/L (ref 39–117)
BUN: 10 mg/dL (ref 6–23)
Creat: 1.23 mg/dL (ref 0.50–1.35)
GFR, Est Non African American: 63 mL/min
Glucose, Bld: 90 mg/dL (ref 70–99)
Potassium: 4 mEq/L (ref 3.5–5.3)
Total Bilirubin: 0.6 mg/dL (ref 0.3–1.2)

## 2013-03-11 MED ORDER — EMTRICITABINE-TENOFOVIR DF 200-300 MG PO TABS: 1.0000 | ORAL_TABLET | Freq: Every day | ORAL | Status: DC

## 2013-03-11 MED ORDER — HYDROXYZINE HCL 10 MG PO TABS: 10.0000 mg | ORAL_TABLET | Freq: Three times a day (TID) | ORAL | Status: DC | PRN

## 2013-03-11 MED ORDER — DAPSONE 100 MG PO TABS: 50.0000 mg | ORAL_TABLET | Freq: Every day | ORAL | Status: DC

## 2013-03-11 MED ORDER — DAPSONE 100 MG PO TABS: 100.0000 mg | ORAL_TABLET | Freq: Every day | ORAL | Status: DC

## 2013-03-11 MED ORDER — DOLUTEGRAVIR SODIUM 50 MG PO TABS: 50.0000 mg | ORAL_TABLET | Freq: Every day | ORAL | Status: DC

## 2013-03-11 NOTE — Assessment & Plan Note (Signed)
I will check his albumin and platelets along with his labs today. I will discuss his options for hepatitis C treatment next visit. He first needs he is stable on his HIV medications

## 2013-03-11 NOTE — Progress Notes (Signed)
  Subjective:    Patient ID: Nathan Ewing, male    DOB: 1950/09/13, 62 y.o.   MRN: [id number]  HPI He comes in for a walk-in visit. He has multiple no-shows and has not had labs since December of 2013 when his CD4 count was 190 and his viral load was down to the 400s. He was on Isentress and Truvada. He reports he has been taking that however keeps running out of Isentress and it sounds like he has had sporadic followup with the pharmacy and getting refills. He denies any new complaints and does endorse transportation issues as his problem. He has had no weight loss, no diarrhea. He is having some itching in his back. He also is going back to [facility] for his psychiatric care   Review of Systems  Constitutional: Negative for fever, activity change, appetite change, fatigue and unexpected weight change.  HENT: Negative for sore throat and trouble swallowing.   Eyes: Negative for visual disturbance.  Respiratory: Negative for cough and shortness of breath.   Cardiovascular: Negative for chest pain and leg swelling.  Gastrointestinal: Negative for nausea, abdominal pain and diarrhea.  Genitourinary: Negative for discharge.  Skin:       Pruritic papillary rash on his back  Neurological: Negative for dizziness, light-headedness and headaches.  Hematological: Negative for adenopathy.  Psychiatric/Behavioral: Negative for dysphoric mood.       Objective:   Physical Exam  Constitutional: He is oriented to person, place, and time. He appears well-developed and well-nourished. No distress.  HENT:  Mouth/Throat: Oropharynx is clear and moist. No oropharyngeal exudate.  Eyes: Right eye exhibits no discharge. Left eye exhibits no discharge. No scleral icterus.  Cardiovascular: Normal rate, regular rhythm and normal heart sounds.   No murmur heard. Pulmonary/Chest: Effort normal and breath sounds normal. No respiratory distress. He has no wheezes.  Lymphadenopathy:    He has no cervical  adenopathy.  Neurological: He is alert and oriented to person, place, and time.  Skin:  + non papillary pruritic rash on back          Assessment & Plan:

## 2013-03-11 NOTE — Assessment & Plan Note (Signed)
Atarax for his itching. This is not consistent with a drug rash.

## 2013-03-11 NOTE — Assessment & Plan Note (Signed)
I will change his medication to tivicay with the Truvada. He is in a get that through the mail through [facility] and start as soon as he can. I will then see him in one month.

## 2013-03-12 LAB — T-HELPER CELL (CD4) - (RCID CLINIC ONLY): CD4 % Helper T Cell: 19 % — ABNORMAL LOW (ref 33–55)

## 2013-03-13 LAB — HIV-1 RNA QUANT-NO REFLEX-BLD: HIV 1 RNA Quant: 128 copies/mL — ABNORMAL HIGH (ref <20)

## 2013-04-14 ENCOUNTER — Telehealth: Payer: Self-pay | Admitting: *Deleted

## 2013-04-14 ENCOUNTER — Ambulatory Visit: Payer: Medicaid Other | Admitting: Internal Medicine

## 2013-04-14 NOTE — Telephone Encounter (Signed)
Attempted to call patient regarding his appt today, he no showed. Phone not in service, this was a 4 week f/u. Wendall Mola

## 2013-04-22 ENCOUNTER — Emergency Department (HOSPITAL_COMMUNITY)
Admission: EM | Admit: 2013-04-22 | Discharge: 2013-04-22 | Disposition: A | Discharge status: Home / Self Care | Payer: Medicaid Other | Attending: Emergency Medicine | Admitting: Emergency Medicine

## 2013-04-22 ENCOUNTER — Encounter (HOSPITAL_COMMUNITY): Payer: Self-pay | Admitting: Emergency Medicine

## 2013-04-22 DIAGNOSIS — F329 Major depressive disorder, single episode, unspecified: Secondary | ICD-10-CM | POA: Insufficient documentation

## 2013-04-22 DIAGNOSIS — R21 Rash and other nonspecific skin eruption: Secondary | ICD-10-CM

## 2013-04-22 DIAGNOSIS — F3289 Other specified depressive episodes: Secondary | ICD-10-CM | POA: Insufficient documentation

## 2013-04-22 DIAGNOSIS — Z21 Asymptomatic human immunodeficiency virus [HIV] infection status: Secondary | ICD-10-CM | POA: Insufficient documentation

## 2013-04-22 DIAGNOSIS — Z8619 Personal history of other infectious and parasitic diseases: Secondary | ICD-10-CM | POA: Insufficient documentation

## 2013-04-22 DIAGNOSIS — Z79899 Other long term (current) drug therapy: Secondary | ICD-10-CM | POA: Insufficient documentation

## 2013-04-22 HISTORY — DX: Depression, unspecified: F32.A

## 2013-04-22 HISTORY — DX: Major depressive disorder, single episode, unspecified: F32.9

## 2013-04-22 MED ORDER — CYPROHEPTADINE HCL 4 MG PO TABS: 4.0000 mg | ORAL_TABLET | Freq: Three times a day (TID) | ORAL | Status: DC | PRN

## 2013-04-22 MED ORDER — TRIAMCINOLONE ACETONIDE 0.1 % EX CREA: TOPICAL_CREAM | Freq: Two times a day (BID) | CUTANEOUS | Status: DC

## 2013-04-22 MED ORDER — CYPROHEPTADINE HCL 2 MG/5ML PO SYRP: 4.0000 mg | ORAL_SOLUTION | Freq: Three times a day (TID) | ORAL | Status: DC

## 2013-04-22 NOTE — ED Provider Notes (Signed)
Nathan Ewing is a 62 y.o. male  Face-to-face evaluation   History: Pruritic rash for several weeks  Physical exam: Vague red, raised indistinct rash on torso; there are patchy areas, but no large confluent areas. Were no vesicles petechiae or drainage.  Assessment: Nonspecific pruritic dermatitis. It is unlikely to be a drug rash. He can be treated symptomatically pending referral to a dermatologist for definitive diagnosis  Medical screening examination/treatment/procedure(s) were conducted as a shared visit with non-physician practitioner(s) and myself.  I personally evaluated the patient during the encounter  Flint Melter, MD 04/23/13 2205

## 2013-04-22 NOTE — ED Provider Notes (Signed)
CSN: [id number]     Arrival date & time 04/22/13  0600 History   None    Chief Complaint  Patient presents with  . Rash    HPI  Nathan Ewing is a 62 y.o. male with a PMH of HIV and depression who presents to the ED for evaluation of a rash.  History was provided by the patient.  Patient states he has had a rash on his upper and lower back for the past two weeks, which is now spreading to his arms bilaterally.  He states it is a sharp pain which is worse when "anything touches it, like my shirt."  He states he has been putting hydrocortisone cream on it with no relief.  He otherwise has been well with no recent fevers, chills, change in appetite/activity, rhinorrhea, congestion, sore throat, chest pain, SOB, abdominal pain, nausea, emesis, diarrhea, constipation, dysuria, weakness, headache, or leg edema.  He denies any hx of allergic reactions with rash in the past.  He states he is allergic to quetiapine and bactrim.  He denies any new soaps or detergents.  He denies anyone else having a similar rash in the home.  He denies any recent medication changes.  He was seen by Dr. Staci Righter (ID) on 03/11/13 and was prescribed Hydroxyzine for his rash, which he states did not improve his pain.     Past Medical History  Diagnosis Date  . HIV infection   . HIV (human immunodeficiency virus infection)   . Condyloma acuminatum - perianal 01/24/2012  . Depression    Past Surgical History  Procedure Laterality Date  . Knee surgery     Family History  Problem Relation Age of Onset  . Heart disease Mother   . Heart disease Father    History  Substance Use Topics  . Smoking status: Never Smoker   . Smokeless tobacco: Never Used  . Alcohol Use: No    Review of Systems  Constitutional: Negative for fever, chills, activity change, appetite change and fatigue.  HENT: Negative for congestion, sore throat, rhinorrhea, neck pain and neck stiffness.   Eyes: Negative for itching and visual  disturbance.  Respiratory: Negative for cough and shortness of breath.   Cardiovascular: Negative for chest pain and leg swelling.  Gastrointestinal: Negative for nausea, vomiting, abdominal pain, diarrhea and constipation.  Genitourinary: Negative for dysuria.  Musculoskeletal: Negative for back pain.  Skin: Positive for rash. Negative for color change and wound.  Neurological: Negative for dizziness, syncope, weakness, light-headedness, numbness and headaches.  Psychiatric/Behavioral: Negative for confusion.    Allergies  Quetiapine and Sulfamethoxazole w-trimethoprim  Home Medications   Current Outpatient Rx  Name  Route  Sig  Dispense  Refill  . acetaminophen (TYLENOL) 500 MG tablet   Oral   Take 1,000 mg by mouth every 6 (six) hours as needed for pain.         . ARIPiprazole (ABILIFY) 20 MG tablet   Oral   Take 20 mg by mouth at bedtime.         Marland Kitchen aspirin-acetaminophen-caffeine (EXCEDRIN MIGRAINE) 250-250-65 MG per tablet   Oral   Take 1 tablet by mouth every 6 (six) hours as needed for pain.         . dapsone 100 MG tablet   Oral   Take 1 tablet (100 mg total) by mouth daily.   30 tablet   11   . dolutegravir (TIVICAY) 50 MG tablet   Oral   Take 1  tablet (50 mg total) by mouth daily.   30 tablet   11   . emtricitabine-tenofovir (TRUVADA) 200-300 MG per tablet   Oral   Take 1 tablet by mouth daily.   30 tablet   11     Patient wants it mailed to him   . hydrOXYzine (ATARAX/VISTARIL) 10 MG tablet   Oral   Take 1 tablet (10 mg total) by mouth every 8 (eight) hours as needed for itching.   30 tablet   1   . traMADol (ULTRAM) 50 MG tablet   Oral   Take 1 tablet (50 mg total) by mouth every 6 (six) hours as needed for pain.   15 tablet   0    BP 119/78  Pulse 74  Temp(Src) 97.8 F (36.6 C) (Oral)  Resp 16  SpO2 96%  Filed Vitals:   04/22/13 0604 04/22/13 0613  BP: 135/88 119/78  Pulse: 72 74  Temp: 97.5 F (36.4 C) 97.8 F (36.6 C)   TempSrc: Oral Oral  Resp: 18 16  SpO2: 96% 96%    Physical Exam  Nursing note and vitals reviewed. Constitutional: He is oriented to person, place, and time. He appears well-developed and well-nourished. No distress.  HENT:  Head: Normocephalic and atraumatic.  Right Ear: External ear normal.  Left Ear: External ear normal.  Nose: Nose normal.  Mouth/Throat: Oropharynx is clear and moist. No oropharyngeal exudate.  Eyes: Conjunctivae are normal. Pupils are equal, round, and reactive to light. Right eye exhibits no discharge. Left eye exhibits no discharge.  Neck: Normal range of motion. Neck supple.  Cardiovascular: Normal rate, regular rhythm, normal heart sounds and intact distal pulses.  Exam reveals no gallop and no friction rub.   No murmur heard. Radial pulses present bilaterally  Pulmonary/Chest: Effort normal and breath sounds normal. No respiratory distress. He has no wheezes. He has no rales. He exhibits no tenderness.  Abdominal: Soft. Bowel sounds are normal. He exhibits no distension. There is no tenderness. There is no rebound and no guarding.  Musculoskeletal: Normal range of motion. He exhibits no edema and no tenderness.  Strength 5/5 in the upper extremities  Neurological: He is alert and oriented to person, place, and time.  Sensation intact in the upper extremities   Skin: Skin is warm and dry. Rash noted. He is not diaphoretic.  Fine diffuse pinpoint patchy red colored papular rash to the upper back and anterior arms bilaterally.  No underlying erythema or edema.  No open wounds.      ED Course  Procedures (including critical care time) Labs Review Labs Reviewed - No data to display Imaging Review No results found.   MDM  No diagnosis found.  Nathan Ewing is a 62 y.o. male with a PMH of HIV and depression who presents to the ED for evaluation of a rash.   Etiology of rash is unclear.  Drug reaction is unlikely given no change in medications.   Patient had no evidence of open wounds, edema, underlying erythema, or other signs of an infectious process.  He was afebrile and non-toxic in appearance.  Patient was prescribed periactin and kenalog cream for outpatient management.  Patient was instructed personally by me to return to the ED if they experience any fever, signs of infection including redness or swelling, worsening condition, or other concerns. He was instructed to follow-up with his ID physician and obtain dermatology referral if he continues to experience discomfort from his rash.  Patient was  in agreement with discharge and plan.     Final impressions: 1. Rash     Luiz Iron PA-C   This patient was discussed with Dr. Arloa Koh, PA-C 04/23/13 [phone]

## 2013-04-22 NOTE — ED Notes (Signed)
Pt states he has had a rash on his back for two weeks, pt states the rash is itchy and feels like sharp stabbing pains like pins and needles. Pt states the rash is spreading to his left arm and right arm. Pt states nothing is making him comfortable and that it is just getting worse. Pt denies HA, N/V/D, difficulties urinating/passing stools, SOB. Pt is A&O X4. Pt chief complaint is his painful and itchy rash.

## 2013-06-24 ENCOUNTER — Telehealth: Payer: Self-pay | Admitting: *Deleted

## 2013-06-24 NOTE — Telephone Encounter (Signed)
Patient called requesting an appointment, stating that the rash had returned to his back and chest and he thought it might be shingles.  Patient requesting appointment with Dr. Luciana Axe, overbooked for 11/13 at 8:45. Andree Coss, RN

## 2013-06-25 ENCOUNTER — Ambulatory Visit (INDEPENDENT_AMBULATORY_CARE_PROVIDER_SITE_OTHER): Payer: Medicaid Other | Admitting: Internal Medicine

## 2013-06-25 ENCOUNTER — Encounter: Payer: Self-pay | Admitting: Internal Medicine

## 2013-06-25 VITALS — BP 142/87 | HR 64 | Temp 97.7°F | Wt 193.0 lb

## 2013-06-25 DIAGNOSIS — B029 Zoster without complications: Secondary | ICD-10-CM | POA: Insufficient documentation

## 2013-06-25 DIAGNOSIS — B20 Human immunodeficiency virus [HIV] disease: Secondary | ICD-10-CM

## 2013-06-25 LAB — COMPLETE METABOLIC PANEL WITH GFR
ALT: 10 U/L (ref 0–53)
Alkaline Phosphatase: 55 U/L (ref 39–117)
Potassium: 4.2 mEq/L (ref 3.5–5.3)
Sodium: 140 mEq/L (ref 135–145)
Total Bilirubin: 0.7 mg/dL (ref 0.3–1.2)
Total Protein: 6.9 g/dL (ref 6.0–8.3)

## 2013-06-25 LAB — CBC WITH DIFFERENTIAL/PLATELET
Basophils Relative: 0 % (ref 0–1)
Eosinophils Absolute: 0.1 10*3/uL (ref 0.0–0.7)
Lymphs Abs: 0.9 10*3/uL (ref 0.7–4.0)
MCH: 30.9 pg (ref 26.0–34.0)
Neutro Abs: 4.6 10*3/uL (ref 1.7–7.7)
Neutrophils Relative %: 76 % (ref 43–77)
Platelets: 175 10*3/uL (ref 150–400)
RBC: 5.02 MIL/uL (ref 4.22–5.81)

## 2013-06-25 MED ORDER — VALACYCLOVIR HCL 1 G PO TABS: 1000.0000 mg | ORAL_TABLET | Freq: Two times a day (BID) | ORAL | Status: DC

## 2013-06-25 MED ORDER — PREDNISONE 10 MG PO KIT: 10.0000 mg | PACK | Freq: Every day | ORAL | Status: DC

## 2013-06-25 NOTE — Assessment & Plan Note (Signed)
I will check his labs today and he will return next week for follow up on his labs.

## 2013-06-25 NOTE — Progress Notes (Signed)
  Subjective:    Patient ID: Nathan Ewing, male    DOB: [birth year], 62 y.o.   MRN: [id number]  HPI He comes in for a work in visit. Yesterday he began to develop back pain with lesions particularly on the right side back over her shoulder and does cross the midline below but the left side. No history of shingles in the past. It is nontender. He reports good compliance with his medications of Truvada and tivicay. He previously was on Isentress in Truvada and changed to tivicay for ease of dosing.  He denies any missed doses he.  He is exercising and he feels well. No weight loss, no diarrhea.   Review of Systems  Constitutional: Negative for fatigue and unexpected weight change.  Eyes: Negative for visual disturbance.  Respiratory: Negative for shortness of breath.   Cardiovascular: Negative for chest pain.  Gastrointestinal: Negative for nausea, abdominal pain and diarrhea.  Musculoskeletal: Positive for back pain.       With lesions  Skin: Positive for rash.  Neurological: Negative for dizziness and headaches.  Hematological: Negative for adenopathy.  Psychiatric/Behavioral: Negative for dysphoric mood.       Objective:   Physical Exam  Constitutional: He is oriented to person, place, and time. He appears well-developed and well-nourished. No distress.  HENT:  Mouth/Throat: No oropharyngeal exudate.  Eyes: Right eye exhibits no discharge. Left eye exhibits no discharge. No scleral icterus.  Cardiovascular: Normal rate, regular rhythm and normal heart sounds.   No murmur heard. Pulmonary/Chest: Effort normal and breath sounds normal. No respiratory distress.  Lymphadenopathy:    He has no cervical adenopathy.  Neurological: He is alert and oriented to person, place, and time.  Skin:  Back with tenderness over lesions, crosses midline, pinpoint lesions with small openings  Psychiatric: He has a normal mood and affect.          Assessment & Plan:

## 2013-06-25 NOTE — Assessment & Plan Note (Signed)
Clinically appears to be shingles. I will give him valacyclovir and then prednisone taper.

## 2013-06-26 LAB — HIV-1 RNA QUANT-NO REFLEX-BLD
HIV 1 RNA Quant: 85 copies/mL — ABNORMAL HIGH (ref <20)
HIV-1 RNA Quant, Log: 1.93 {Log} — ABNORMAL HIGH (ref <1.30)

## 2013-07-02 ENCOUNTER — Telehealth: Payer: Self-pay | Admitting: *Deleted

## 2013-07-02 ENCOUNTER — Ambulatory Visit: Payer: Medicaid Other | Admitting: Internal Medicine

## 2013-07-02 NOTE — Telephone Encounter (Signed)
Called patient and left voice mail to call the clinic regarding today's no show. Per Dr. Luciana Axe if he is better he can just schedule an appt in 3 months with labs 2 weeks prior. Wendall Mola

## 2013-08-26 ENCOUNTER — Emergency Department (HOSPITAL_COMMUNITY)
Admission: EM | Admit: 2013-08-26 | Discharge: 2013-08-26 | Disposition: A | Discharge status: Home / Self Care | Payer: Medicaid Other | Attending: Emergency Medicine | Admitting: Emergency Medicine

## 2013-08-26 ENCOUNTER — Encounter (HOSPITAL_COMMUNITY): Payer: Self-pay | Admitting: Emergency Medicine

## 2013-08-26 ENCOUNTER — Emergency Department (HOSPITAL_COMMUNITY)
Admission: EM | Admit: 2013-08-26 | Discharge: 2013-08-26 | Disposition: A | Discharge status: Home / Self Care | Payer: Medicaid Other | Source: Home / Self Care | Attending: Emergency Medicine | Admitting: Emergency Medicine

## 2013-08-26 DIAGNOSIS — F1410 Cocaine abuse, uncomplicated: Secondary | ICD-10-CM | POA: Insufficient documentation

## 2013-08-26 DIAGNOSIS — Z8719 Personal history of other diseases of the digestive system: Secondary | ICD-10-CM | POA: Insufficient documentation

## 2013-08-26 DIAGNOSIS — F3289 Other specified depressive episodes: Secondary | ICD-10-CM | POA: Insufficient documentation

## 2013-08-26 DIAGNOSIS — Z79899 Other long term (current) drug therapy: Secondary | ICD-10-CM | POA: Insufficient documentation

## 2013-08-26 DIAGNOSIS — F329 Major depressive disorder, single episode, unspecified: Secondary | ICD-10-CM

## 2013-08-26 DIAGNOSIS — Z21 Asymptomatic human immunodeficiency virus [HIV] infection status: Secondary | ICD-10-CM | POA: Insufficient documentation

## 2013-08-26 DIAGNOSIS — Z888 Allergy status to other drugs, medicaments and biological substances status: Secondary | ICD-10-CM | POA: Insufficient documentation

## 2013-08-26 DIAGNOSIS — Z8619 Personal history of other infectious and parasitic diseases: Secondary | ICD-10-CM | POA: Diagnosis not present

## 2013-08-26 DIAGNOSIS — Z882 Allergy status to sulfonamides status: Secondary | ICD-10-CM

## 2013-08-26 DIAGNOSIS — Z008 Encounter for other general examination: Secondary | ICD-10-CM | POA: Diagnosis present

## 2013-08-26 LAB — CBC WITH DIFFERENTIAL/PLATELET
BASOS ABS: 0 10*3/uL (ref 0.0–0.1)
Basophils Relative: 0 % (ref 0–1)
EOS ABS: 0.1 10*3/uL (ref 0.0–0.7)
EOS PCT: 1 % (ref 0–5)
HCT: 44.6 % (ref 39.0–52.0)
Hemoglobin: 15.2 g/dL (ref 13.0–17.0)
LYMPHS ABS: 1.3 10*3/uL (ref 0.7–4.0)
LYMPHS PCT: 16 % (ref 12–46)
MCH: 32.3 pg (ref 26.0–34.0)
MCHC: 34.1 g/dL (ref 30.0–36.0)
MCV: 94.9 fL (ref 78.0–100.0)
Monocytes Absolute: 0.7 10*3/uL (ref 0.1–1.0)
Monocytes Relative: 9 % (ref 3–12)
NEUTROS PCT: 74 % (ref 43–77)
Neutro Abs: 5.9 10*3/uL (ref 1.7–7.7)
PLATELETS: 160 10*3/uL (ref 150–400)
RBC: 4.7 MIL/uL (ref 4.22–5.81)
RDW: 13.8 % (ref 11.5–15.5)
WBC: 8 10*3/uL (ref 4.0–10.5)

## 2013-08-26 LAB — ETHANOL: Alcohol, Ethyl (B): <11 mg/dL (ref 0–11)

## 2013-08-26 LAB — COMPREHENSIVE METABOLIC PANEL
ALK PHOS: 80 U/L (ref 39–117)
ALT: 13 U/L (ref 0–53)
AST: 23 U/L (ref 0–37)
Albumin: 4 g/dL (ref 3.5–5.2)
BUN: 12 mg/dL (ref 6–23)
CALCIUM: 9.6 mg/dL (ref 8.4–10.5)
CO2: 30 meq/L (ref 19–32)
Chloride: 104 mEq/L (ref 96–112)
Creatinine, Ser: 1.32 mg/dL (ref 0.50–1.35)
GFR calc Af Amer: 66 mL/min — ABNORMAL LOW (ref >90)
GFR calc non Af Amer: 57 mL/min — ABNORMAL LOW (ref >90)
GLUCOSE: 86 mg/dL (ref 70–99)
POTASSIUM: 4.6 meq/L (ref 3.7–5.3)
SODIUM: 143 meq/L (ref 137–147)
TOTAL PROTEIN: 7.5 g/dL (ref 6.0–8.3)
Total Bilirubin: 0.4 mg/dL (ref 0.3–1.2)

## 2013-08-26 LAB — RAPID URINE DRUG SCREEN, HOSP PERFORMED
Amphetamines: NOT DETECTED
BENZODIAZEPINES: NOT DETECTED
Barbiturates: NOT DETECTED
Cocaine: POSITIVE — AB
Opiates: NOT DETECTED
TETRAHYDROCANNABINOL: NOT DETECTED

## 2013-08-26 NOTE — ED Provider Notes (Signed)
CSN: [id number]     Arrival date & time 08/26/13  1516 History   First MD Initiated Contact with Patient 08/26/13 1833     Chief Complaint  Patient presents with  . detox    (Consider location/radiation/quality/duration/timing/severity/associated sxs/prior Treatment) HPI Comments: Patient is a 63 year old male with history of chronic substance abuse. He is a daily user of cocaine and is here requesting help. He denies any alcohol use and denies using any other drugs. He has been in treatment before, most recently 8 months ago in [place]. He denies any chest pain or shortness of breath. He denies any headache or other complaints. He denies suicidal ideation or homicidal ideation  The history is provided by the patient.    Past Medical History  Diagnosis Date  . HIV infection   . HIV (human immunodeficiency virus infection)   . Condyloma acuminatum - perianal 01/24/2012  . Depression    Past Surgical History  Procedure Laterality Date  . Knee surgery     Family History  Problem Relation Age of Onset  . Heart disease Mother   . Heart disease Father    History  Substance Use Topics  . Smoking status: Never Smoker   . Smokeless tobacco: Never Used  . Alcohol Use: No    Review of Systems  All other systems reviewed and are negative.    Allergies  Quetiapine and Sulfamethoxazole-trimethoprim  Home Medications   Current Outpatient Rx  Name  Route  Sig  Dispense  Refill  . ARIPiprazole (ABILIFY) 20 MG tablet   Oral   Take 20 mg by mouth at bedtime.         . dapsone 100 MG tablet   Oral   Take 1 tablet (100 mg total) by mouth daily.   30 tablet   11   . dolutegravir (TIVICAY) 50 MG tablet   Oral   Take 1 tablet (50 mg total) by mouth daily.   30 tablet   11   . emtricitabine-tenofovir (TRUVADA) 200-300 MG per tablet   Oral   Take 1 tablet by mouth daily.   30 tablet   11     Patient wants it mailed to him    BP 130/81  Pulse 84  Temp(Src) 97.5 F  (36.4 C) (Oral)  Resp 18  Ht 5\' 10"  (1.778 m)  Wt 197 lb (89.359 kg)  BMI 28.27 kg/m2  SpO2 97% Physical Exam  Nursing note and vitals reviewed. Constitutional: He is oriented to person, place, and time. He appears well-developed and well-nourished. No distress.  HENT:  Head: Normocephalic and atraumatic.  Mouth/Throat: Oropharynx is clear and moist.  Eyes: EOM are normal. Pupils are equal, round, and reactive to light.  Neck: Normal range of motion. Neck supple.  Cardiovascular: Normal rate, regular rhythm and normal heart sounds.   Pulmonary/Chest: Effort normal and breath sounds normal.  Abdominal: Soft. Bowel sounds are normal. He exhibits no distension. There is no tenderness.  Musculoskeletal: Normal range of motion. He exhibits no edema.  Neurological: He is alert and oriented to person, place, and time.  Skin: Skin is warm and dry. He is not diaphoretic.    ED Course  Procedures (including critical care time) Labs Review Labs Reviewed  URINE RAPID DRUG SCREEN (HOSP PERFORMED) - Abnormal; Notable for the following:    Cocaine POSITIVE (*)    All other components within normal limits  COMPREHENSIVE METABOLIC PANEL - Abnormal; Notable for the following:    GFR calc  non Af Amer 57 (*)    GFR calc Af Amer 66 (*)    All other components within normal limits  CBC WITH DIFFERENTIAL  ETHANOL   Imaging Review No results found.    MDM  No diagnosis found. Consult placed to TTS, however I did not realized [facility].  TTS called to inform me that there is no detox for cocaine and that [facility].  Contact information given.  Patient discharged.    Geoffery Lyonsouglas Dencil Cayson, MD 08/28/13 [phone]

## 2013-08-26 NOTE — ED Provider Notes (Signed)
Medical screening examination/treatment/procedure(s) were performed by non-physician practitioner and as supervising physician I was immediately available for consultation/collaboration.  EKG Interpretation   None         Gwyneth SproutWhitney Williom Cedar, MD 08/26/13 1503

## 2013-08-26 NOTE — Discharge Instructions (Signed)
Chemical Dependency °Chemical dependency is an addiction to drugs or alcohol. It is characterized by the repeated behavior of seeking out and using drugs and alcohol despite harmful consequences to the health and safety of ones self and others.  °RISK FACTORS °There are certain situations or behaviors that increase a person's risk for chemical dependency. These include: °· A family history of chemical dependency. °· A history of mental health issues, including depression and anxiety. °· A home environment where drugs and alcohol are easily available to you. °· Drug or alcohol use at a young age. °SYMPTOMS  °The following symptoms can indicate chemical dependency: °· Inability to limit the use of drugs or alcohol. °· Nausea, sweating, shakiness, and anxiety that occurs when alcohol or drugs are not being used. °· An increase in amount of drugs or alcohol that is necessary to get drunk or high. °People who experience these symptoms can assess their use of drugs and alcohol by asking themselves the following questions: °· Have you been told by friends or family that they are worried about your use of alcohol or drugs? °· Do friends and family ever tell you about things you did while drinking alcohol or using drugs that you do not remember? °· Do you lie about using alcohol or drugs or about the amounts you use? °· Do you have difficulty completing daily tasks unless you use alcohol or drugs? °· Is the level of your work or school performance lower because of your drug or alcohol use? °· Do you get sick from using drugs or alcohol but keep using anyway? °· Do you feel uncomfortable in social situations unless you use alcohol or drugs? °· Do you use drugs or alcohol to help forget problems?  °An answer of yes to any of these questions may indicate chemical dependency. Professional evaluation is suggested. °Document Released: 07/24/2001 Document Revised: 10/22/2011 Document Reviewed: 10/05/2010 °ExitCare® Patient  Information ©2014 [employer]. ° ° ° ° ° °Emergency Department Resource Guide °1) Find a Doctor and Pay Out of Pocket °Although you won't have to find out who is covered by your insurance plan, it is a good idea to ask around and get recommendations. You will then need to call the office and see if the doctor you have chosen will accept you as a new patient and what types of options they offer for patients who are self-pay. Some doctors offer discounts or will set up payment plans for their patients who do not have insurance, but you will need to ask so you aren't surprised when you get to your appointment. ° °2) Contact Your Local Health Department °Not all health departments have doctors that can see patients for sick visits, but many do, so it is worth a call to see if yours does. If you don't know where your local health department is, you can check in your phone book. The CDC also has a tool to help you locate your state's health department, and many state websites also have listings of all of their local health departments. ° °3) Find a Walk-in Clinic °If your illness is not likely to be very severe or complicated, you may want to try a walk in clinic. These are popping up all over the country in pharmacies, drugstores, and shopping centers. They're usually staffed by nurse practitioners or physician assistants that have been trained to treat common illnesses and complaints. They're usually fairly quick and inexpensive. However, if you have serious medical issues or chronic medical problems, these   are probably not your best option. ° °No Primary Care Doctor: °- Call [facility] Connect at  832-8000 - they can help you locate a primary care doctor that  accepts your insurance, provides certain services, etc. °- Physician Referral Service- 1-800-533-3463 ° °Chronic Pain Problems: °Organization         Address  Phone   Notes  °[facility]  (336) 297-2271 Patients need to be referred by their  primary care doctor.  ° °Medication Assistance: °Organization         Address  Phone   Notes  °[place] [facility] [address] °[place], [place] [address] (336) 641-8030 --Must be a resident of [place] °-- Must have NO insurance coverage whatsoever (no Medicaid/ Medicare, etc.) °-- The pt. MUST have a primary care doctor that directs their care regularly and follows them in the community °  °[facility]  (866) 331-1348   °[employer]  (888) 892-1162   ° °Agencies that provide inexpensive medical care: °Organization         Address  Phone   Notes  °[facility]  (336) 832-8035   °[facility]    (336) 832-7272   °[facility] [address] °[place], [place] [address] (336) 832-4777   °[facility] [address], °[place] (336) 271-4999   °[facility]    (336) 373-0678   °[facility]    (336) 272-1050   °[facility] ° [address], [place] Phone:  (336) 832-4444, Fax:  (336) 832-4440 Hours of Operation:  9 am - 6 pm, M-F.  Also accepts Medicaid/Medicare and self-pay.  °[facility] ° [address], [place] Phone: (336) 832-3150, Fax: (336) 832-3151. Hours of Operation:  8:30 am - 5:30 pm, M-F.  Also accepts Medicaid and self-pay.  °[facility] [place] [address], [place] Phone: (336) 878-6027   °[facility] [address], [place] (336)723-1848, Ext. 123 Mondays & Thursdays: 7-9 AM.  First 15 patients are seen on a first come, first serve basis. °  ° °Medicaid-accepting [place] Providers: ° °Organization         Address  Phone   Notes  °[facility] [address], [place] (336) 641-2100 Also accepts self-pay patients.  °[facility] [address], [place] ° (336) 856-9996   °[facility] [address], [place] (336) 288-8857   °[facility] [address] [place] [address], [place] (336) 299-7000   °Veita Bland [address], [place]  ° (336) 373-1557 Only accepts [place] Access Medicaid patients after they have their name applied to their card.  ° °Self-Pay (no insurance) in [place]: ° °Organization         Address  Phone   Notes  °Sickle Cell Patients, [facility] [address], [place] (336) 832-1970   °[facility] [address], [place] (336) 832-4400   ° [facility] ° [address], [place] (336) 992-4800   °[facility]/Dr. Osei-Bonsu ° [address] [place] [address], [place] or [address], [place] (336) 841-8500 Phone number for both [place] and [place] locations is the same.  °[facility] [address], [place] (336) 299-0000   °[facility] [address] [place] [address], [place] or [address] (336) 852-7530 °(336) 878-2260   °[facility] [address], [place] (336) 350-1642, phone; (336) 294-5005, fax Sees patients 1st and 3rd Saturday of every month.  Must not qualify for public or private insurance (i.e. Medicaid, Medicare, [place] Health Choice, Veterans' Benefits) • Household income should be no more than 200% of the poverty level •The clinic cannot treat you if you are pregnant or think you are pregnant • Sexually transmitted diseases are not treated at the clinic.  ° ° °Dental Care: °Organization         Address  Phone  Notes  °[facility] [facility] [address], [place] (336) 641-6152 Accepts children up to age 21 who are enrolled in Medicaid or [place] Health Choice; pregnant women with a Medicaid card; and children who have applied for Medicaid or [place] Health Choice, but were declined, whose parents can pay a reduced fee  at time of service.  °[facility] [place]  [address], [place] (336) 641-7733 Accepts children up to age 21 who are enrolled in Medicaid or [place] Health Choice; pregnant women with a Medicaid card; and children who have applied for Medicaid or [place] Health Choice, but were declined, whose parents can pay a reduced fee at time of service.  °[facility] ° [address], [place] (336) 641-4533 Patients are seen by appointment only. Walk-ins are not accepted. [facility] will see patients 18 years of age and older. °Monday - Tuesday (8am-5pm) °Most Wednesdays (8:30-5pm) °$30 per visit, cash only  °[facility] ° [address], [place] (336) 641-4533 Patients are seen by appointment only. Walk-ins are not accepted. [facility] will see patients 18 years of age and older. °One Wednesday Evening (Monthly: Volunteer Based).  $30 per visit, cash only  °[facility]  (919) 537-3737 for adults; Children under age 4, call Graduate Pediatric Dentistry at (919) [phone]. Children aged 4-14, please call (919) [phone] to request a pediatric application. ° Dental services are provided in all areas of dental care including fillings, crowns and bridges, complete and partial dentures, implants, gum treatment, root canals, and extractions. Preventive care is also provided. Treatment is provided to both adults and children. °Patients are selected via a lottery and there is often a waiting list. °  °[facility] [address], °[place] ° (336) 763-8833 [email or web address] °  °[facility] [address], [place], [place] (336)723-1848, Ext. 123 Second and Fourth Thursday of each month, opens at 6:30 AM; Clinic ends at 9 AM.  Patients are seen on a first-come first-served basis, and a limited number are seen during each clinic.  ° °[facility] ° [address],  [place], [place] (336) 723-7904   Eligibility Requirements °You must have lived in [place], [place], or [place] counties for at least the last three months. °  You cannot be eligible for state or federal sponsored healthcare insurance, including Veterans Administration, Medicaid, or Medicare. °  You generally cannot be eligible for healthcare insurance through your employer.  °  How to apply: °Eligibility screenings are held every Tuesday and Wednesday afternoon from 1:00 pm until 4:00 pm. You do not need an appointment for the interview!  °[facility] [address], [place], [place] 336-631-2330   °[facility]  336-342-8273   °[facility]  336-703-3100   °[facility]  336-570-6415   ° °Behavioral Health Resources in the Community: °Intensive Outpatient Programs °Organization         Address  Phone  Notes  °[facility] [address], [place], [place] 336-878-6098   °[facility] [address], [place], [place] 336-832-9800   °[facility] [address], [place], [place] ° 336-882-2125   °[facility] [address],  °[place], [place] 1-800-853-5163 or 336-641-4981   °Substance Abuse Resources °Organization         Address  Phone  Notes  °[facility]  336-882-2125   °[facility]  336-784-9470   °[facility]  336-285-9073   °[facility]  336-845-3988   °Residential & Outpatient Substance Abuse Program  1-800-659-3381   °Psychological Services °Organization         Address  Phone  Notes  °[facility]  336- 832-9600   °[facility]  336- 378-7881   °[facility] [address], [place] 1-800-853-5163 or 336-641-4981   ° °Mobile Crisis Teams °Organization         Address  Phone  Notes  °[facility], Mobile Crisis Care Unit  1-877-626-1772   °Assertive °[facility]otherapeutic  Services ° [address] [place], [place] 336-834-9664   °Sharon DeEsch [address] °[place] 336-554-5454   ° °Self-Help/Support Groups °Organization         Address  Phone             Notes  °[facility] [place] - variety of support groups  336- 373-1402 Call for more information  °Narcotics Anonymous (NA), [facility] [address], °[place]  2 meetings at this location  ° °Residential Treatment Programs °Organization         Address  Phone  Notes  °[facility] [address],    °[place]  1-866-801-8205   °[facility] ° [address], [place], [place] 704-293-8524   °[facility] [address], [place] 336-845-3988 Admissions: 8am-3pm M-F  °[facility] [address]    °[place], [place] 336-841-1104   °[facility] [address], [place], [place] 336-379-7146   °[facility] [address]  °[place], [place] 336-285-9073   °[facility] - Intensive Outpatient [address], [place], [place] 336-852-3033   °[facility] [address]  °[place], [place] 1-877-615-2722 or 336-784-9470   °[facility] ([facility] [address] , [place] 336-227-7417 Accepts Medicaid  °[facility] [address]  °[place] 1-800-659-3381 Substance Abuse/Addiction Treatment  ° °[place] Behavioral Health Resources °Organization         Address  Phone  Notes  °[facility]  (888) 581-9988   °Julie Brannon, PhD [address] [place], [place]   (336) 349-5553 or (336) 951-0000   °[facility]   [address] °[place], [place] (336) 349-4454   °[facility] [address], [place],  (336) 342-8316 Insurance/Medicaid/sponsorship through [facility]  °[facility] [address]                                      [place], [place] (336)  342-8316 Therapy/tele-psych/case  °[facility] [address]  ° [place], [place] (336) 349-2233    °Dr. Arfeen  (336) 349-4544   °[facility] [place]  [employer] [place] [facility] 1) [address], [place] °2) [address], [place] °3)  [address], [place] (336) 349-3220 °(336) 342-7768 ° °(336) 342-8140   °[place] [facility] (336) 342-1394 or (336) 342-3537 (After Hours)    ° ° ° °

## 2013-08-26 NOTE — ED Provider Notes (Signed)
CSN: [id number]     Arrival date & time 08/26/13  1315 History  This chart was scribed for non-physician practitioner, Francee PiccoloJennifer Azai Gaffin, PA-C working with Gwyneth SproutWhitney Plunkett, MD by Greggory StallionKayla Andersen, ED scribe. This patient was seen in room [facility]TR4/[facility]PT4 and the patient's care was started at 1:34 PM.   Chief Complaint  Patient presents with  . Medical Clearance   The history is provided by the patient. No language interpreter was used.   HPI Comments: Nathan Ewing is a 63 y.o. male who presents to the Emergency Department requesting detox from cocaine. His last use was this morning around 6 AM. He denies any CP or SOB breath associated with cocaine use today. Patient states he has a long standing history of cocaine abuse. He states occassional ETOH use with the cocaine, but no daily use. Patient denies the use of any other recreational drugs. He denies any HI/SI, hallucinations. Patient denies any history of cardiac medical problems. He denies any history of cocaine induced chest pain. Denies any history of hospitalizations for RD use or ETOH use. Denies any physical complaints at this times.    Past Medical History  Diagnosis Date  . HIV infection   . HIV (human immunodeficiency virus infection)   . Condyloma acuminatum - perianal 01/24/2012  . Depression    Past Surgical History  Procedure Laterality Date  . Knee surgery     Family History  Problem Relation Age of Onset  . Heart disease Mother   . Heart disease Father    History  Substance Use Topics  . Smoking status: Never Smoker   . Smokeless tobacco: Never Used  . Alcohol Use: No    Review of Systems  Constitutional: Negative for fever and chills.  Respiratory: Negative for shortness of breath.   Cardiovascular: Negative for chest pain.  Psychiatric/Behavioral: Negative for suicidal ideas and hallucinations.  All other systems reviewed and are negative.   Allergies  Quetiapine and Sulfamethoxazole-trimethoprim  Home  Medications   Current Outpatient Rx  Name  Route  Sig  Dispense  Refill  . ARIPiprazole (ABILIFY) 20 MG tablet   Oral   Take 20 mg by mouth at bedtime.         . cyproheptadine (PERIACTIN) 4 MG tablet   Oral   Take 4 mg by mouth 3 (three) times daily as needed (rash).         . dapsone 100 MG tablet   Oral   Take 1 tablet (100 mg total) by mouth daily.   30 tablet   11   . dolutegravir (TIVICAY) 50 MG tablet   Oral   Take 1 tablet (50 mg total) by mouth daily.   30 tablet   11   . emtricitabine-tenofovir (TRUVADA) 200-300 MG per tablet   Oral   Take 1 tablet by mouth daily.   30 tablet   11     Patient wants it mailed to him    BP 126/79  Pulse 98  Temp(Src) 98.5 F (36.9 C) (Oral)  Resp 14  SpO2 96%  Physical Exam  Constitutional: He is oriented to person, place, and time. He appears well-developed and well-nourished. No distress.  HENT:  Head: Normocephalic and atraumatic.  Right Ear: Tympanic membrane, external ear and ear canal normal.  Left Ear: Tympanic membrane, external ear and ear canal normal.  Nose: Nose normal.  Mouth/Throat: Uvula is midline, oropharynx is clear and moist and mucous membranes are normal.  Eyes: Conjunctivae and EOM  are normal. Pupils are equal, round, and reactive to light.  Neck: Normal range of motion. Neck supple.  Cardiovascular: Normal rate, regular rhythm and normal heart sounds.   Pulmonary/Chest: Effort normal and breath sounds normal. He exhibits no tenderness.  Abdominal: Soft. There is no tenderness.  Musculoskeletal: Normal range of motion.  Lymphadenopathy:    He has no cervical adenopathy.  Neurological: He is alert and oriented to person, place, and time.  Skin: Skin is warm and dry. He is not diaphoretic.  Psychiatric: He has a normal mood and affect. His speech is normal and behavior is normal. Thought content normal. Cognition and memory are normal. He expresses no homicidal and no suicidal ideation. He  expresses no suicidal plans and no homicidal plans.    ED Course  Procedures (including critical care time)  DIAGNOSTIC STUDIES: Oxygen Saturation is 96% on RA, normal by my interpretation.    COORDINATION OF CARE: 1:37 PM-Discussed treatment plan which includes resources for outpatient cocaine detox with pt at bedside and pt agreed to plan.   Labs Review Labs Reviewed - No data to display Imaging Review No results found.  EKG Interpretation   None       MDM   1. Cocaine abuse     Afebrile, NAD, non-toxic appearing, AAOx4.   Patient presented requesting inpatient detoxification from cocaine. He denies any other recreational drug use or long-standing history of alcohol abuse. No history of hospitalizations for cocaine-induced chest pain or cardiac problems. He has no complaints of chest pain or shortness of breath or any other physical complaints at this time. Patient is denying any homicidal or suicidal ideations. He denies any hallucinations. Discussed with patient that there are no inpatient cocaine detoxification programs available. Provided outpatient resource guide for patient for help with his cocaine abuse. Patient is agreeable to outpatient resources. Do not feel patient is currently a harm to himself or others and has no other complaints that at this time warrant further evaluation. Return precautions discussed. Patient is agreeable to plan. Patient is stable at time of discharge    I personally performed the services described in this documentation, which was scribed in my presence. The recorded information has been reviewed and is accurate.    Jeannetta Ellis, PA-C 08/26/13 1359

## 2013-08-26 NOTE — Consult Note (Signed)
Spoke to Dr. Judd Lienelo and he agreed with TTS that pt may not need to be evaluated for psych related or SA related issues.  Pt is using cocaine and using alcohol periodically.  There is no detox for cocaine and pt won't qualify for alcohol detox based on use.

## 2013-08-26 NOTE — ED Notes (Signed)
The pt is here for detox from cocaine.  He smokes it and injects it.  His last use was this am

## 2013-08-26 NOTE — ED Notes (Signed)
Pt requesting detox from cocaine; last use this morning; denies si/hi; calm cooperative

## 2013-08-26 NOTE — Discharge Instructions (Signed)
Please follow up with your primary care physician in 1-2 days. If you do not have one please call the [facility] and wellness Center number listed above. Please call a number below to discuss outpatient help with your cocaine abuse. Please read all discharge instructions and return precautions.   Cocaine Abuse and Chemical Dependency WHEN IS DRUG USE A PROBLEM? Anytime drug use is interfering with normal living activities it has become abuse. This includes problems with family and friends. Psychological dependence has developed when your mind tells you that the drug is needed. This is usually followed by physical dependence which has developed when continuing increases of drug are required to get the same feeling or "high". This is known as addiction or chemical dependency. A person's risk is much higher if there is a history of chemical dependency in the family. SIGNS OF CHEMICAL DEPENDENCY:  Been told by friends or family that drugs have become a problem.  Fighting when using drugs.  Having blackouts (not remembering what you do while using).  Feel sick from using drugs but continue using.  Lie about use or amounts of drugs (chemicals) used.  Need chemicals to get you going.  Suffer in work International aid/development worker or school because of drug use.  Get sick from use of drugs but continue to use anyway.  Need drugs to relate to people or feel comfortable in social situations.  Use drugs to forget problems. Yes answered to any of the above signs of chemical dependency indicates there are problems. The longer the use of drugs continues, the greater the problems will become. If there is a family history of drug or alcohol use it is best not to experiment with these drugs. Experimentation leads to tolerance and needing to use more of the drug to get the same feeling. This is followed by addiction where drugs become the most important part of life. It becomes more important to take drugs than participate in  the other usual activities of life including relating to friends and family. Addiction is followed by dependency where drugs are now needed not just to get high but to feel normal. Addiction cannot be cured but it can be stopped. This often requires outside help and the care of professionals. Treatment centers are listed in the yellow pages under: Cocaine, Narcotics, and Alcoholics Anonymous. Most hospitals and clinics can refer you to a specialized care center. WHAT IS COCAINE? Cocaine is a strong nervous system stimulant which speeds up the body and gives the user the feeling that they have increased energy, loss of appetite and feelings of great pleasure. This "high" which begins within several minutes and lasts for less than an hour is followed by a "crash". The crash and depressed feelings that come with it cause a craving for the drug to regain the high. HOW IS COCANINE USED? Cocaine is snorted, injected, and smoked as free- base or crack. Because smoking the drug produces a greater high it is also associated with a greater low. It is therefore more rapidly addicting. WHAT ARE THE EFFECTS OF COCAINE? It is an anesthetic (pain killer) and a stimulant (it causes a high which gives a false feeling of well being). It increases heart and breathing rates with increases in body temperature and blood pressure. It removes appetite. It causes seizures (convulsions) along with nausea (feeling sick to your stomach), vomiting and stomach pain. This dangerous combination can lead to death. Trying to keep the high feeling leads to greater and greater drug use  and this leads to addiction. Addiction can only be helped by stopping use of all chemicals. This is hard but may save your life. If the addiction is continued, the only possible outcome is loss of self respect and self esteem, violence, death, and eventually prison if the addict is fortunate enough to be caught and able to receive help prior to this last life  ending event. OTHER HEALTH RISKS OF COCAINE AND ALL DRUG USE ARE:  The increased possibility of getting AIDS or hepatitis (liver inflammation).  Having a baby born which is addicted to cocaine and must go through painful withdrawal including shaking, jerking, and crying in pain. Many of the babies die. Other babies go through life with lifelong disabilities and learning problems. HOW TO STAY DRUG FREE ONCE YOU HAVE QUIT USING:  Develop healthy activities and form friends who do not use drugs.  Stay away from the drug scene.  Tell the pusher or former friend you have other better things to do.  Have ready excuses available about why you cannot use. For more help or information contact your local physician, clinic, hospital or dial 1-800-cocaine (636)243-4220). Document Released: 07/27/2000 Document Revised: 10/22/2011 Document Reviewed: 03/17/2008 [facility] Patient Information 2014 [place], [place].  If you do not have a primary care doctor to follow up with regarding today's visit, please call the [facility] at [phone] to make an appointment. Hours of operation are 10am - 7pm, Monday through Friday, and they have a sliding scale fee.     RESOURCE GUIDE  Emergency Shelter:  [facility] [phone]   Intensive Outpatient Programs: [facility]      [address] [place], [place] [phone] Both a day and evening program       [facility] Outpatient     [address]        [place], [place] [address] [phone]         ADS: [facility] [address] [place] [phone]  [facility] ACCESS LINE: [phone] or [phone] [address] [place], [place] [address] [email or web address]   Substance Abuse Resources: - [facility] Drug [facility]  [phone] - [facility] [phone] - The  [place] [phone] Floydene Flock [phone] - Residential & Outpatient Substance Abuse Program  7246729241  Psychological Services: [facility]  [phone] [facility] Services  [phone] - [facility], [phone] [place]. [address], [place], ACCESS LINE: 4635629859 or [phone], [email or web address]  Mobile Crisis Teams:                                        [facility]         Mobile Crisis Care Unit [phone]             [facility] [address] Ginette Otto [phone]                                         [facility] [address] [place] [phone]  Self-Help/Support Groups: Mental [facility]oc[facility] of [employer] of support groups [phone] (call for more info)  Narcotics Anonymous (NA) Caring Services [address] [place] - 2 meetings at this  location  Residential Treatment Programs:  [facility]      [address]        [place]       [phone]         [facility] [address], [place] [address] [place], [place]  [id number] [phone]  [facility]  [address] [place], [place] [address] [phone] Admissions: 8am-3pm M-F  [facility]     [address]        [place], [place] [address]       [phone]         [facility] [address] [place], [place] [phone]  [facility] [address] [place], [place] [phone]  [facility] - Intensive Outpatient      [address]     [place], [place]       [phone]         [facility] ([facility]     [address] [place], [place] [phone] or [phone]  [facility]idential Treatment Services (RTS), Medicaid [address] [place], [place] [phone]  [facility] [address]                                                [address] [place] [phone]  [facility] Resources: [phone]- [phone]               General Therapy                                                Angie FavaJulie Brannon, PhD        [address]                                       [place], [place] [address]         [phone]   Insurance  [facility]   [address] [place], [place] [address] [phone]  [facility] [address]  [place], [place] [phone] Insurance/Medicaid/sponsorship through [facility]  Faith and Families                                              [address]                                        [place], [place] [address]    Therapy/tele-psych/case         [phone]          [facility] [address]  [place], [place]  [address]  Adolescent/group home/case management [phone]                                           Creola CornJulia Brannon  PhD       General therapy       Insurance   [phone]         Dr. Lolly Mustache, Insurance, M-F [phone]

## 2013-08-26 NOTE — ED Notes (Signed)
DR Judd LienELO IN TO EVALUATE PATIENT

## 2013-09-25 ENCOUNTER — Emergency Department (HOSPITAL_COMMUNITY)
Admission: EM | Admit: 2013-09-25 | Discharge: 2013-09-25 | Disposition: A | Discharge status: Home / Self Care | Payer: Medicaid Other | Attending: Emergency Medicine | Admitting: Emergency Medicine

## 2013-09-25 ENCOUNTER — Encounter (HOSPITAL_COMMUNITY): Payer: Self-pay | Admitting: Emergency Medicine

## 2013-09-25 DIAGNOSIS — Z79899 Other long term (current) drug therapy: Secondary | ICD-10-CM | POA: Insufficient documentation

## 2013-09-25 DIAGNOSIS — Z21 Asymptomatic human immunodeficiency virus [HIV] infection status: Secondary | ICD-10-CM | POA: Insufficient documentation

## 2013-09-25 DIAGNOSIS — F329 Major depressive disorder, single episode, unspecified: Secondary | ICD-10-CM | POA: Insufficient documentation

## 2013-09-25 DIAGNOSIS — F3289 Other specified depressive episodes: Secondary | ICD-10-CM | POA: Insufficient documentation

## 2013-09-25 DIAGNOSIS — B360 Pityriasis versicolor: Secondary | ICD-10-CM

## 2013-09-25 MED ORDER — HYDROXYZINE HCL 25 MG PO TABS: 25.0000 mg | ORAL_TABLET | Freq: Four times a day (QID) | ORAL | Status: DC

## 2013-09-25 MED ORDER — FLUCONAZOLE 100 MG PO TABS: 200.0000 mg | ORAL_TABLET | Freq: Every day | ORAL | Status: AC

## 2013-09-25 NOTE — Discharge Instructions (Signed)
Diflucan once a day for the rash. I think your rash is fungal. You can also use selenium sulfide lotion topically daily for 2 weeks. Please call and follow up with your doctor in 1 week for recheck. Vistaril for itching.    Tinea Versicolor Tinea versicolor is a common yeast infection of the skin. This condition becomes known when the yeast on our skin starts to overgrow (yeast is a normal inhabitant on our skin). This condition is noticed as white or light brown patches on brown skin, and is more evident in the summer on tanned skin. These areas are slightly scaly if scratched. The light patches from the yeast become evident when the yeast creates "holes in your suntan". This is most often noticed in the summer. The patches are usually located on the chest, back, pubis, neck and body folds. However, it may occur on any area of body. Mild itching and inflammation (redness or soreness) may be present. DIAGNOSIS  The diagnosisof this is made clinically (by looking). Cultures from samples are usually not needed. Examination under the microscope may help. However, yeast is normally found on skin. The diagnosis still remains clinical. Examination under Wood's Ultraviolet Light can determine the extent of the infection. TREATMENT  This common infection is usually only of cosmetic (only a concern to your appearance). It is easily treated with dandruff shampoo used during showers or bathing. Vigorous scrubbing will eliminate the yeast over several days time. The light areas in your skin may remain for weeks or months after the infection is cured unless your skin is exposed to sunlight. The lighter or darker spots caused by the fungus that remain after complete treatment are not a sign of treatment failure; it will take a long time to resolve. Your caregiver may recommend a number of commercial preparations or medication by mouth if home care is not working. Recurrence is common and preventative medication may be  necessary. This skin condition is not highly contagious. Special care is not needed to protect close friends and family members. Normal hygiene is usually enough. Follow up is required only if you develop complications (such as a secondary infection from scratching), if recommended by your caregiver, or if no relief is obtained from the preparations used. Document Released: 07/27/2000 Document Revised: 10/22/2011 Document Reviewed: 09/08/2008 [facility] Patient Information 2014 [place], [place].

## 2013-09-25 NOTE — ED Provider Notes (Signed)
CSN: [id number]     Arrival date & time 09/25/13  4098 History  This chart was scribed for non-physician practitioner Jaynie Crumble, PA-Cworking with Laray Anger, DO by Dorothey Baseman, ED Scribe. This patient was seen in room TR07C/TR07C and the patient's care was started at 9:44 AM.    Chief Complaint  Patient presents with  . Rash   The history is provided by the patient. No language interpreter was used.   HPI Comments: Nathan Ewing is a 63 y.o. male who presents to the Emergency Department complaining of an itching rash to the back onset about a week ago that has been progressively worsening for the past 3 days. He reports some associated pain to the area secondary to scratching. He reports applying hydrocortisone cream to the areas at home without significant relief. Patient states this type of rash is new for him. He denies any recent changes in at-home products (i.e. Detergent, soap, etc.). He denies any associated symptoms at this time. Patient has a history of HIV and states that he has been compliant with all of his medications.  PCP- Dr. Orvan Falconer  Past Medical History  Diagnosis Date  . HIV infection   . HIV (human immunodeficiency virus infection)   . Condyloma acuminatum - perianal 01/24/2012  . Depression    Past Surgical History  Procedure Laterality Date  . Knee surgery     Family History  Problem Relation Age of Onset  . Heart disease Mother   . Heart disease Father    History  Substance Use Topics  . Smoking status: Never Smoker   . Smokeless tobacco: Never Used  . Alcohol Use: No    Review of Systems  Constitutional: Negative for fever.  Skin: Positive for rash.   Allergies  Quetiapine and Sulfamethoxazole-trimethoprim  Home Medications   Current Outpatient Rx  Name  Route  Sig  Dispense  Refill  . ARIPiprazole (ABILIFY) 20 MG tablet   Oral   Take 20 mg by mouth at bedtime.         . dapsone 100 MG tablet   Oral   Take 1 tablet (100  mg total) by mouth daily.   30 tablet   11   . dolutegravir (TIVICAY) 50 MG tablet   Oral   Take 1 tablet (50 mg total) by mouth daily.   30 tablet   11   . emtricitabine-tenofovir (TRUVADA) 200-300 MG per tablet   Oral   Take 1 tablet by mouth daily.   30 tablet   11     Patient wants it mailed to him    Triage Vitals: BP 140/86  Pulse 63  Temp(Src) 97.2 F (36.2 C) (Oral)  Resp 18  Ht 5\' 10"  (1.778 m)  Wt 200 lb (90.719 kg)  BMI 28.70 kg/m2  SpO2 98%  Physical Exam  Nursing note and vitals reviewed. Constitutional: He is oriented to person, place, and time. He appears well-developed and well-nourished. No distress.  HENT:  Head: Normocephalic and atraumatic.  Eyes: Conjunctivae are normal.  Neck: Normal range of motion. Neck supple.  Pulmonary/Chest: Effort normal. No respiratory distress.  Abdominal: He exhibits no distension.  Musculoskeletal: Normal range of motion.  Neurological: He is alert and oriented to person, place, and time.  Skin: Skin is warm and dry. Rash noted.  Diffuse, hyperpigmented, patchy rash over chest, bilateral shoulders, and entire back with excoriation and mild scaling. Non-tender, no drainage.   Psychiatric: He has a normal mood  and affect. His behavior is normal.    ED Course  Procedures (including critical care time)  DIAGNOSTIC STUDIES: Oxygen Saturation is 98% on room air, normal by my interpretation.    COORDINATION OF CARE: 9:47 AM- Will review patient's past medical records and consult with Dr. Clarene DukeMcManus. Discussed treatment plan with patient at bedside and patient verbalized agreement.   10:37 AM- Consulted with Dr. Orvan Falconerampbell. Will discharge patient with medication to manage symptoms. Discussed treatment plan with patient at bedside and patient verbalized agreement.    Labs Review Labs Reviewed - No data to display Imaging Review No results found.  EKG Interpretation   None       MDM   Final diagnoses:  Tinea  versicolor     Patient emergency department with a hyperpigmented, scaly rash to entire torso. He has history of HIV, last CD4 count is 170, 3 months ago. Patient on antivirals and is taking dapsone preventatively. Patient's rash is suspicious 14 year versicolor. I discussed case with Dr. Orvan Falconerampbell and affect who follows patient along with Dr.:. He recommended starting patient on fluconazole 100 mg daily 2 weeks. He's to followup with their clinic.  Filed Vitals:   09/25/13 0922  BP: 140/86  Pulse: 63  Temp: 97.2 F (36.2 C)  TempSrc: Oral  Resp: 18  Height: 5\' 10"  (1.778 m)  Weight: 200 lb (90.719 kg)  SpO2: 98%   I personally performed the services described in this documentation, which was scribed in my presence. The recorded information has been reviewed and is accurate.     Lottie Musselatyana A Randolf Sansoucie, PA-C 09/25/13 1045  Sonita Michiels A Reinhart Saulters, PA-C 09/25/13 1046

## 2013-09-25 NOTE — ED Notes (Signed)
Patient states had rash appear on back/sides x 1 wk ago.  Patient states itches and hurts.  Denies any other symptoms.

## 2013-09-26 NOTE — ED Provider Notes (Signed)
Medical screening examination/treatment/procedure(s) were performed by non-physician practitioner and as supervising physician I was immediately available for consultation/collaboration.  EKG Interpretation   None         Laray AngerKathleen M Rylan Kaufmann, DO 09/26/13 1132

## 2013-10-27 ENCOUNTER — Encounter: Payer: Self-pay | Admitting: Internal Medicine

## 2013-10-27 ENCOUNTER — Ambulatory Visit (INDEPENDENT_AMBULATORY_CARE_PROVIDER_SITE_OTHER): Payer: Medicaid Other | Admitting: Internal Medicine

## 2013-10-27 VITALS — BP 135/89 | HR 71 | Temp 98.2°F | Ht 70.5 in | Wt 204.0 lb

## 2013-10-27 DIAGNOSIS — R21 Rash and other nonspecific skin eruption: Secondary | ICD-10-CM

## 2013-10-27 DIAGNOSIS — B20 Human immunodeficiency virus [HIV] disease: Secondary | ICD-10-CM

## 2013-10-27 DIAGNOSIS — B1710 Acute hepatitis C without hepatic coma: Secondary | ICD-10-CM

## 2013-10-27 LAB — CBC WITH DIFFERENTIAL/PLATELET
BASOS ABS: 0 10*3/uL (ref 0.0–0.1)
Basophils Relative: 0 % (ref 0–1)
EOS ABS: 0.1 10*3/uL (ref 0.0–0.7)
Eosinophils Relative: 1 % (ref 0–5)
HCT: 43.4 % (ref 39.0–52.0)
HEMOGLOBIN: 14.7 g/dL (ref 13.0–17.0)
Lymphocytes Relative: 11 % — ABNORMAL LOW (ref 12–46)
Lymphs Abs: 1 10*3/uL (ref 0.7–4.0)
MCH: 31.6 pg (ref 26.0–34.0)
MCHC: 33.9 g/dL (ref 30.0–36.0)
MCV: 93.3 fL (ref 78.0–100.0)
MONOS PCT: 6 % (ref 3–12)
Monocytes Absolute: 0.5 10*3/uL (ref 0.1–1.0)
NEUTROS ABS: 7.4 10*3/uL (ref 1.7–7.7)
NEUTROS PCT: 82 % — AB (ref 43–77)
PLATELETS: 161 10*3/uL (ref 150–400)
RBC: 4.65 MIL/uL (ref 4.22–5.81)
RDW: 13.7 % (ref 11.5–15.5)
WBC: 9 10*3/uL (ref 4.0–10.5)

## 2013-10-27 LAB — COMPLETE METABOLIC PANEL WITH GFR
ALBUMIN: 3.8 g/dL (ref 3.5–5.2)
ALT: 23 U/L (ref 0–53)
AST: 27 U/L (ref 0–37)
Alkaline Phosphatase: 102 U/L (ref 39–117)
BUN: 9 mg/dL (ref 6–23)
CALCIUM: 9.1 mg/dL (ref 8.4–10.5)
CO2: 31 meq/L (ref 19–32)
Chloride: 106 mEq/L (ref 96–112)
Creat: 1.12 mg/dL (ref 0.50–1.35)
GFR, EST AFRICAN AMERICAN: 82 mL/min
GFR, EST NON AFRICAN AMERICAN: 71 mL/min
GLUCOSE: 75 mg/dL (ref 70–99)
POTASSIUM: 4.3 meq/L (ref 3.5–5.3)
Sodium: 141 mEq/L (ref 135–145)
TOTAL PROTEIN: 6.4 g/dL (ref 6.0–8.3)
Total Bilirubin: 0.2 mg/dL (ref 0.2–1.2)

## 2013-10-27 LAB — IRON: Iron: 41 ug/dL — ABNORMAL LOW (ref 42–165)

## 2013-10-27 MED ORDER — EMTRICITABINE-TENOFOVIR DF 200-300 MG PO TABS: 1.0000 | ORAL_TABLET | Freq: Every day | ORAL | Status: AC

## 2013-10-27 MED ORDER — CLOTRIMAZOLE 1 % EX CREA: 1.0000 "application " | TOPICAL_CREAM | Freq: Two times a day (BID) | CUTANEOUS | Status: AC

## 2013-10-27 MED ORDER — DAPSONE 100 MG PO TABS: 100.0000 mg | ORAL_TABLET | Freq: Every day | ORAL | Status: AC

## 2013-10-27 MED ORDER — DOLUTEGRAVIR SODIUM 50 MG PO TABS: 50.0000 mg | ORAL_TABLET | Freq: Every day | ORAL | Status: AC

## 2013-10-27 NOTE — Progress Notes (Signed)
  Subjective:    Patient ID: Nathan Ewing, male    DOB: 1950/12/25, 63 y.o.   MRN: [id number]  Rash Pertinent negatives include no diarrhea, fatigue or shortness of breath.   He comes in for a work in visit. He has had a rash off and on and has been to the ED for the current rash.  Tried fluconazole with no improvement.  He previously was on Isentress in [place] and changed to tivicay for ease of dosing.  He denies any missed doses he.  He is exercising and he feels well. No weight loss, no diarrhea.     Review of Systems  Constitutional: Negative for fatigue and unexpected weight change.  Eyes: Negative for visual disturbance.  Respiratory: Negative for shortness of breath.   Cardiovascular: Negative for chest pain.  Gastrointestinal: Negative for nausea, abdominal pain and diarrhea.  Skin: Positive for rash.  Neurological: Negative for dizziness and headaches.  Hematological: Negative for adenopathy.  Psychiatric/Behavioral: Negative for dysphoric mood.       Objective:   Physical Exam  Constitutional: He is oriented to person, place, and time. He appears well-developed and well-nourished. No distress.  HENT:  Mouth/Throat: No oropharyngeal exudate.  Eyes: Right eye exhibits no discharge. Left eye exhibits no discharge. No scleral icterus.  Cardiovascular: Normal rate, regular rhythm and normal heart sounds.   No murmur heard. Pulmonary/Chest: Effort normal and breath sounds normal. No respiratory distress.  Lymphadenopathy:    He has no cervical adenopathy.  Neurological: He is alert and oriented to person, place, and time.  Skin:  Hyperpigmented rash on chest, pinpoint lesion on back  Psychiatric: He has a normal mood and affect.          Assessment & Plan:

## 2013-10-27 NOTE — Assessment & Plan Note (Signed)
Doing well Labs today 

## 2013-10-27 NOTE — Assessment & Plan Note (Signed)
Looks like versicolor now, will try topical treatment.

## 2013-10-27 NOTE — Assessment & Plan Note (Signed)
I will check labs and discuss more with the patient next visit.

## 2013-10-28 LAB — HEPATITIS C RNA QUANTITATIVE
HCV QUANT: 9391832 [IU]/mL — AB (ref <15)
HCV Quantitative Log: 6.97 {Log} — ABNORMAL HIGH (ref <1.18)

## 2013-10-28 LAB — HIV-1 RNA QUANT-NO REFLEX-BLD
HIV 1 RNA Quant: <20 copies/mL (ref <20)
HIV-1 RNA Quant, Log: <1.3 {Log} (ref <1.30)

## 2013-10-28 LAB — T-HELPER CELL (CD4) - (RCID CLINIC ONLY)
CD4 % Helper T Cell: 18 % — ABNORMAL LOW (ref 33–55)
CD4 T CELL ABS: 180 /uL — AB (ref 400–2700)

## 2013-10-28 LAB — ANA: Anti Nuclear Antibody(ANA): NEGATIVE

## 2013-10-30 ENCOUNTER — Telehealth: Payer: Self-pay | Admitting: *Deleted

## 2013-10-30 ENCOUNTER — Emergency Department (HOSPITAL_COMMUNITY)
Admission: EM | Admit: 2013-10-30 | Discharge: 2013-10-30 | Disposition: A | Discharge status: Home / Self Care | Payer: Medicaid Other | Attending: Emergency Medicine | Admitting: Emergency Medicine

## 2013-10-30 ENCOUNTER — Encounter (HOSPITAL_COMMUNITY): Payer: Self-pay | Admitting: Emergency Medicine

## 2013-10-30 DIAGNOSIS — F329 Major depressive disorder, single episode, unspecified: Secondary | ICD-10-CM | POA: Insufficient documentation

## 2013-10-30 DIAGNOSIS — Z79899 Other long term (current) drug therapy: Secondary | ICD-10-CM | POA: Insufficient documentation

## 2013-10-30 DIAGNOSIS — L0390 Cellulitis, unspecified: Secondary | ICD-10-CM

## 2013-10-30 DIAGNOSIS — F3289 Other specified depressive episodes: Secondary | ICD-10-CM | POA: Insufficient documentation

## 2013-10-30 DIAGNOSIS — L03818 Cellulitis of other sites: Principal | ICD-10-CM

## 2013-10-30 DIAGNOSIS — Z8619 Personal history of other infectious and parasitic diseases: Secondary | ICD-10-CM | POA: Insufficient documentation

## 2013-10-30 DIAGNOSIS — L02818 Cutaneous abscess of other sites: Secondary | ICD-10-CM | POA: Insufficient documentation

## 2013-10-30 DIAGNOSIS — Z21 Asymptomatic human immunodeficiency virus [HIV] infection status: Secondary | ICD-10-CM | POA: Insufficient documentation

## 2013-10-30 MED ORDER — CLINDAMYCIN HCL 150 MG PO CAPS: 300.0000 mg | ORAL_CAPSULE | Freq: Three times a day (TID) | ORAL | Status: AC

## 2013-10-30 MED ORDER — ACETAMINOPHEN 325 MG PO TABS
650.0000 mg | ORAL_TABLET | Freq: Once | ORAL | Status: AC
  Administered 2013-10-30: 650 mg via ORAL
  Filled 2013-10-30: qty 2

## 2013-10-30 NOTE — ED Provider Notes (Signed)
CSN: [id number]     Arrival date & time 10/30/13  1422 History  This chart was scribed for non-physician practitioner, Junious Silk, PA-C,working with Shon Baton, MD, by Karle Plumber, ED Scribe.  This patient was seen in room TR08C/TR08C and the patient's care was started at 4:14 PM.  Chief Complaint  Patient presents with  . Cyst  . Neck Pain   The history is provided by the patient. No language interpreter was used.   HPI Comments:  BENARD MINTURN is a 63 y.o. male with h/o HIV, who presents to the Emergency Department complaining of a pressure-like pain on the back of his neck secondary to a cyst that started approximately three days ago. He states lying down makes the pain worse and the pain radiates around the area. He reports putting alcohol on the area with no relief of symptoms. He states he has not taken anything for pain. He denies drainage of the area. He denies fever, chills, nausea, vomiting, body aches, SOB, or CP. He denies h/o DM.    Past Medical History  Diagnosis Date  . HIV infection   . HIV (human immunodeficiency virus infection)   . Condyloma acuminatum - perianal 01/24/2012  . Depression    Past Surgical History  Procedure Laterality Date  . Knee surgery     Family History  Problem Relation Age of Onset  . Heart disease Mother   . Heart disease Father    History  Substance Use Topics  . Smoking status: Never Smoker   . Smokeless tobacco: Never Used  . Alcohol Use: No    Review of Systems  Constitutional: Negative for fever.  Musculoskeletal: Positive for neck pain.  All other systems reviewed and are negative.    Allergies  Quetiapine and Sulfamethoxazole-trimethoprim  Home Medications   Current Outpatient Rx  Name  Route  Sig  Dispense  Refill  . ARIPiprazole (ABILIFY) 20 MG tablet   Oral   Take 20 mg by mouth daily.          . clotrimazole (LOTRIMIN) 1 % cream   Topical   Apply 1 application topically 2 (two) times  daily.   60 g   2   . dapsone 100 MG tablet   Oral   Take 1 tablet (100 mg total) by mouth daily.   30 tablet   11   . dolutegravir (TIVICAY) 50 MG tablet   Oral   Take 1 tablet (50 mg total) by mouth daily.   30 tablet   11   . emtricitabine-tenofovir (TRUVADA) 200-300 MG per tablet   Oral   Take 1 tablet by mouth daily.   30 tablet   11    Triage Vitals: BP 120/73  Pulse 88  Temp(Src) 98.7 F (37.1 C) (Oral)  Resp 18  Ht 5' 10.5" (1.791 m)  Wt 200 lb (90.719 kg)  BMI 28.28 kg/m2  SpO2 95% Physical Exam  Nursing note and vitals reviewed. Constitutional: He is oriented to person, place, and time. He appears well-developed and well-nourished. No distress.  HENT:  Head: Normocephalic and atraumatic.  Right Ear: External ear normal.  Left Ear: External ear normal.  Nose: Nose normal.  4 cm area of induration and erythema without fluctuance on posterior scalp. No drainage noted.   Eyes: Conjunctivae are normal.  Neck: Normal range of motion. No tracheal deviation present.  Cardiovascular: Normal rate, regular rhythm and normal heart sounds.   Pulmonary/Chest: Effort normal and breath sounds  normal. No stridor.  Abdominal: Soft. He exhibits no distension. There is no tenderness.  Musculoskeletal: Normal range of motion.  Neurological: He is alert and oriented to person, place, and time.  Skin: Skin is warm and dry. He is not diaphoretic.  Psychiatric: He has a normal mood and affect. His behavior is normal.    ED Course  Procedures (including critical care time) DIAGNOSTIC STUDIES: Oxygen Saturation is 95% on RA, adequate by my interpretation.   COORDINATION OF CARE: 4:17 PM- Will prescribe antibiotics for infection. Will give Tylenol prior to discharge. Pt verbalizes understanding and agrees to plan.  Medications  acetaminophen (TYLENOL) tablet 650 mg (650 mg Oral Given 10/30/13 1623)    Labs Review Labs Reviewed - No data to display Imaging Review No  results found.   EKG Interpretation None      MDM   Final diagnoses:  Cellulitis    Patient presents to ED with cellulitis around posterior scalp. There are many small areas of induration without evidence of drainable abscess. Patient was discharged with clindamycin and given strict return instructions for follow up in 2 days to ensure his cellulitis has not developed into abscess. He is afebrile without signs of systemic illness. Patient is quite well appearing. Reasons to return to the ED immediately were discussed. Vital signs stable for discharge. Patient / Family / Caregiver informed of clinical course, understand medical decision-making process, and agree with plan.   I personally performed the services described in this documentation, which was scribed in my presence. The recorded information has been reviewed and is accurate.    Mora BellmanHannah S Miranda Frese, PA-C 10/31/13 0151

## 2013-10-30 NOTE — ED Notes (Signed)
Pt complains of pain in neck and knot on neck in hair line, onset 2 days ago.

## 2013-10-30 NOTE — Discharge Instructions (Signed)
Cellulitis °Cellulitis is an infection of the skin and the tissue under the skin. The infected area is usually red and tender. This happens most often in the arms and lower legs. °HOME CARE  °· Take your antibiotic medicine as told. Finish the medicine even if you start to feel better. °· Keep the infected arm or leg raised (elevated). °· Put a warm cloth on the area up to 4 times per day. °· Only take medicines as told by your doctor. °· Keep all doctor visits as told. °GET HELP RIGHT AWAY IF:  °· You have a fever. °· You feel very sleepy. °· You throw up (vomit) or have watery poop (diarrhea). °· You feel sick and have muscle aches and pains. °· You see red streaks on the skin coming from the infected area. °· Your red area gets bigger or turns a dark color. °· Your bone or joint under the infected area is painful after the skin heals. °· Your infection comes back in the same area or different area. °· You have a puffy (swollen) bump in the infected area. °· You have new symptoms. °MAKE SURE YOU:  °· Understand these instructions. °· Will watch your condition. °· Will get help right away if you are not doing well or get worse. °Document Released: 01/16/2008 Document Revised: 01/29/2012 Document Reviewed: 10/15/2011 °[employer]® Patient Information ©2014 [employer]. ° °

## 2013-10-30 NOTE — Telephone Encounter (Signed)
Attempted to call patient to find out when he could go for ultrasound. Phone not in service. Left message with his daughter, emergency contact to ask him to call the clinic. Nathan MolaJacqueline Milik Gilreath

## 2013-10-31 NOTE — ED Provider Notes (Signed)
Medical screening examination/treatment/procedure(s) were performed by non-physician practitioner and as supervising physician I was immediately available for consultation/collaboration.   EKG Interpretation None        Madia Carvell F Hutch Rhett, MD 10/31/13 1459 

## 2013-11-11 ENCOUNTER — Telehealth: Payer: Self-pay | Admitting: *Deleted

## 2013-11-11 ENCOUNTER — Ambulatory Visit: Payer: Medicaid Other | Admitting: Internal Medicine

## 2013-11-11 NOTE — Telephone Encounter (Signed)
Per daughter, patient doesn't have a phone right now.  She will try to have him call the office when she sees him. Andree CossHowell, Caran Storck M, RN

## 2013-11-11 NOTE — Telephone Encounter (Signed)
Left message with patient's daughter re: trying to get in touch with him for today's missed visit, as well as scheduling the ultrasound so he can begin hep c treatment. Daughter stated that she thought he didn't have a phone right now, would try to get a message to him to give us a call. Andree CossHowell, Makeshia Seat M, RN

## 2013-11-19 ENCOUNTER — Other Ambulatory Visit: Payer: Self-pay | Admitting: Internal Medicine

## 2013-11-19 ENCOUNTER — Encounter: Payer: Self-pay | Admitting: *Deleted

## 2013-11-19 DIAGNOSIS — Z79899 Other long term (current) drug therapy: Secondary | ICD-10-CM

## 2013-11-19 DIAGNOSIS — Z113 Encounter for screening for infections with a predominantly sexual mode of transmission: Secondary | ICD-10-CM

## 2014-04-20 ENCOUNTER — Ambulatory Visit: Payer: Medicaid Other | Admitting: Internal Medicine

## 2014-04-21 ENCOUNTER — Ambulatory Visit: Payer: Medicaid Other | Admitting: Internal Medicine

## 2014-05-04 ENCOUNTER — Other Ambulatory Visit: Payer: Medicaid Other

## 2014-05-04 ENCOUNTER — Other Ambulatory Visit (HOSPITAL_COMMUNITY)
Admission: RE | Admit: 2014-05-04 | Discharge: 2014-05-04 | Disposition: A | Discharge status: Home / Self Care | Payer: Medicaid Other | Source: Ambulatory Visit | Attending: Internal Medicine | Admitting: Internal Medicine

## 2014-05-04 ENCOUNTER — Telehealth: Payer: Self-pay | Admitting: *Deleted

## 2014-05-04 DIAGNOSIS — Z113 Encounter for screening for infections with a predominantly sexual mode of transmission: Secondary | ICD-10-CM

## 2014-05-04 DIAGNOSIS — B20 Human immunodeficiency virus [HIV] disease: Secondary | ICD-10-CM

## 2014-05-04 DIAGNOSIS — Z79899 Other long term (current) drug therapy: Secondary | ICD-10-CM

## 2014-05-04 LAB — COMPLETE METABOLIC PANEL WITHOUT GFR
ALT: 15 U/L (ref 0–53)
AST: 27 U/L (ref 0–37)
Albumin: 4.3 g/dL (ref 3.5–5.2)
Alkaline Phosphatase: 71 U/L (ref 39–117)
BUN: 15 mg/dL (ref 6–23)
CO2: 21 meq/L (ref 19–32)
Calcium: 9.4 mg/dL (ref 8.4–10.5)
Chloride: 106 meq/L (ref 96–112)
Creat: 1.48 mg/dL — ABNORMAL HIGH (ref 0.50–1.35)
GFR, Est African American: 58 mL/min — ABNORMAL LOW
GFR, Est Non African American: 50 mL/min — ABNORMAL LOW
Glucose, Bld: 85 mg/dL (ref 70–99)
Potassium: 4.5 meq/L (ref 3.5–5.3)
Sodium: 141 meq/L (ref 135–145)
Total Bilirubin: 0.6 mg/dL (ref 0.2–1.2)
Total Protein: 7.2 g/dL (ref 6.0–8.3)

## 2014-05-04 LAB — CBC WITH DIFFERENTIAL/PLATELET
Basophils Absolute: 0 10*3/uL (ref 0.0–0.1)
Basophils Relative: 0 % (ref 0–1)
EOS ABS: 0.1 10*3/uL (ref 0.0–0.7)
EOS PCT: 1 % (ref 0–5)
HCT: 43.6 % (ref 39.0–52.0)
HEMOGLOBIN: 15.2 g/dL (ref 13.0–17.0)
LYMPHS ABS: 0.8 10*3/uL (ref 0.7–4.0)
Lymphocytes Relative: 13 % (ref 12–46)
MCH: 31.2 pg (ref 26.0–34.0)
MCHC: 34.9 g/dL (ref 30.0–36.0)
MCV: 89.5 fL (ref 78.0–100.0)
MONOS PCT: 7 % (ref 3–12)
Monocytes Absolute: 0.4 10*3/uL (ref 0.1–1.0)
Neutro Abs: 5.1 10*3/uL (ref 1.7–7.7)
Neutrophils Relative %: 79 % — ABNORMAL HIGH (ref 43–77)
Platelets: 205 10*3/uL (ref 150–400)
RBC: 4.87 MIL/uL (ref 4.22–5.81)
RDW: 13.9 % (ref 11.5–15.5)
WBC: 6.4 10*3/uL (ref 4.0–10.5)

## 2014-05-04 LAB — LIPID PANEL
Cholesterol: 136 mg/dL (ref 0–200)
HDL: 46 mg/dL (ref >39)
LDL CALC: 66 mg/dL (ref 0–99)
Total CHOL/HDL Ratio: 3 Ratio
Triglycerides: 122 mg/dL (ref <150)
VLDL: 24 mg/dL (ref 0–40)

## 2014-05-04 LAB — SYPHILIS: RPR W/REFLEX TO RPR TITER AND TREPONEMAL ANTIBODIES, TRADITIONAL SCREENING AND DIAGNOSIS ALGORITHM
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
## 2014-05-06 LAB — T-HELPER CELL (CD4) - (RCID CLINIC ONLY)
CD4 % Helper T Cell: 24 % — ABNORMAL LOW (ref 33–55)
CD4 T CELL ABS: 180 /uL — AB (ref 400–2700)

## 2014-05-06 LAB — HIV-1 RNA QUANT-NO REFLEX-BLD
HIV 1 RNA QUANT: 1375 {copies}/mL — AB (ref <20)
HIV-1 RNA QUANT, LOG: 3.14 {Log} — AB (ref <1.30)

## 2014-05-06 LAB — URINE CYTOLOGY ANCILLARY ONLY
CHLAMYDIA, DNA PROBE: NEGATIVE
Neisseria Gonorrhea: NEGATIVE

## 2014-06-01 ENCOUNTER — Ambulatory Visit: Payer: Medicaid Other | Admitting: Internal Medicine

## 2014-08-30 ENCOUNTER — Telehealth: Payer: Self-pay | Admitting: *Deleted

## 2014-08-30 NOTE — Telephone Encounter (Signed)
Patient called stating he has moved to [place] and requested a phone # for an ID doctor. [facility]. Wendall MolaJacqueline Cockerham

## 2014-12-29 ENCOUNTER — Telehealth: Payer: Self-pay | Admitting: *Deleted

## 2014-12-29 NOTE — Telephone Encounter (Signed)
Received signed release of information from [facility] in [place], [place].  Patient has transferred care.  Last office note and labs faxed to [phone] so that they can schedule the patient.  Medical record release request placed in [facility] folder for medical records. Andree CossHowell, Jean Alejos M, RN
